# Patient Record
Sex: Female | Born: 1975 | Race: Black or African American | Hispanic: No | Marital: Single | Smoking: Current every day smoker
Health system: Southern US, Community
[De-identification: ages and names within clinical notes are randomized; demographics above are authoritative.]

## PROBLEM LIST (undated history)

## (undated) DIAGNOSIS — E669 Obesity, unspecified: Secondary | ICD-10-CM

## (undated) DIAGNOSIS — I82409 Acute embolism and thrombosis of unspecified deep veins of unspecified lower extremity: Secondary | ICD-10-CM

## (undated) DIAGNOSIS — I2699 Other pulmonary embolism without acute cor pulmonale: Secondary | ICD-10-CM

## (undated) DIAGNOSIS — E039 Hypothyroidism, unspecified: Secondary | ICD-10-CM

## (undated) DIAGNOSIS — E0590 Thyrotoxicosis, unspecified without thyrotoxic crisis or storm: Secondary | ICD-10-CM

## (undated) DIAGNOSIS — K219 Gastro-esophageal reflux disease without esophagitis: Secondary | ICD-10-CM

## (undated) DIAGNOSIS — E049 Nontoxic goiter, unspecified: Secondary | ICD-10-CM

## (undated) DIAGNOSIS — B20 Human immunodeficiency virus [HIV] disease: Secondary | ICD-10-CM

## (undated) DIAGNOSIS — Z21 Asymptomatic human immunodeficiency virus [HIV] infection status: Secondary | ICD-10-CM

## (undated) HISTORY — PX: HERNIA REPAIR: SHX51

---

## 2000-12-14 ENCOUNTER — Encounter (HOSPITAL_COMMUNITY): Admission: RE | Admit: 2000-12-14 | Discharge: 2001-01-13 | Payer: Self-pay | Admitting: *Deleted

## 2000-12-20 ENCOUNTER — Encounter: Admission: RE | Admit: 2000-12-20 | Discharge: 2000-12-20 | Payer: Self-pay | Admitting: Obstetrics

## 2001-01-03 ENCOUNTER — Encounter: Admission: RE | Admit: 2001-01-03 | Discharge: 2001-01-03 | Payer: Self-pay | Admitting: Obstetrics

## 2001-01-03 ENCOUNTER — Inpatient Hospital Stay (HOSPITAL_COMMUNITY): Admission: AD | Admit: 2001-01-03 | Discharge: 2001-01-08 | Payer: Self-pay | Admitting: Obstetrics & Gynecology

## 2001-01-04 ENCOUNTER — Encounter: Payer: Self-pay | Admitting: Obstetrics

## 2001-01-10 ENCOUNTER — Encounter: Admission: RE | Admit: 2001-01-10 | Discharge: 2001-01-10 | Payer: Self-pay | Admitting: Obstetrics

## 2001-01-10 ENCOUNTER — Inpatient Hospital Stay (HOSPITAL_COMMUNITY): Admission: AD | Admit: 2001-01-10 | Discharge: 2001-01-10 | Payer: Self-pay | Admitting: Obstetrics & Gynecology

## 2001-01-17 ENCOUNTER — Encounter: Admission: RE | Admit: 2001-01-17 | Discharge: 2001-01-17 | Payer: Self-pay | Admitting: Obstetrics

## 2001-01-24 ENCOUNTER — Encounter: Admission: RE | Admit: 2001-01-24 | Discharge: 2001-01-24 | Payer: Self-pay | Admitting: Obstetrics

## 2001-01-25 ENCOUNTER — Encounter (HOSPITAL_COMMUNITY): Admission: RE | Admit: 2001-01-25 | Discharge: 2001-02-11 | Payer: Self-pay | Admitting: Obstetrics & Gynecology

## 2001-01-31 ENCOUNTER — Encounter: Admission: RE | Admit: 2001-01-31 | Discharge: 2001-01-31 | Payer: Self-pay | Admitting: Obstetrics

## 2001-02-10 ENCOUNTER — Encounter: Payer: Self-pay | Admitting: Obstetrics & Gynecology

## 2001-02-10 ENCOUNTER — Inpatient Hospital Stay (HOSPITAL_COMMUNITY): Admission: AD | Admit: 2001-02-10 | Discharge: 2001-02-13 | Payer: Self-pay | Admitting: Obstetrics & Gynecology

## 2001-02-10 ENCOUNTER — Encounter (INDEPENDENT_AMBULATORY_CARE_PROVIDER_SITE_OTHER): Payer: Self-pay | Admitting: Specialist

## 2007-03-12 ENCOUNTER — Emergency Department (HOSPITAL_COMMUNITY): Admission: EM | Admit: 2007-03-12 | Discharge: 2007-03-12 | Payer: Self-pay | Admitting: Emergency Medicine

## 2007-06-18 ENCOUNTER — Emergency Department (HOSPITAL_COMMUNITY): Admission: EM | Admit: 2007-06-18 | Discharge: 2007-06-18 | Payer: Self-pay | Admitting: Emergency Medicine

## 2007-06-19 ENCOUNTER — Encounter (INDEPENDENT_AMBULATORY_CARE_PROVIDER_SITE_OTHER): Payer: Self-pay | Admitting: Emergency Medicine

## 2007-06-19 ENCOUNTER — Ambulatory Visit (HOSPITAL_COMMUNITY): Admission: RE | Admit: 2007-06-19 | Discharge: 2007-06-19 | Payer: Self-pay | Admitting: Emergency Medicine

## 2007-06-19 ENCOUNTER — Ambulatory Visit: Payer: Self-pay | Admitting: Vascular Surgery

## 2007-09-08 ENCOUNTER — Emergency Department (HOSPITAL_COMMUNITY): Admission: EM | Admit: 2007-09-08 | Discharge: 2007-09-09 | Payer: Self-pay | Admitting: Emergency Medicine

## 2008-01-06 ENCOUNTER — Emergency Department (HOSPITAL_COMMUNITY): Admission: EM | Admit: 2008-01-06 | Discharge: 2008-01-06 | Payer: Self-pay | Admitting: Emergency Medicine

## 2008-01-25 ENCOUNTER — Emergency Department (HOSPITAL_COMMUNITY): Admission: EM | Admit: 2008-01-25 | Discharge: 2008-01-26 | Payer: Self-pay | Admitting: Emergency Medicine

## 2008-01-26 ENCOUNTER — Ambulatory Visit: Payer: Self-pay | Admitting: Vascular Surgery

## 2008-01-26 ENCOUNTER — Encounter (INDEPENDENT_AMBULATORY_CARE_PROVIDER_SITE_OTHER): Payer: Self-pay | Admitting: Emergency Medicine

## 2008-01-26 ENCOUNTER — Ambulatory Visit (HOSPITAL_COMMUNITY): Admission: RE | Admit: 2008-01-26 | Discharge: 2008-01-26 | Payer: Self-pay | Admitting: Emergency Medicine

## 2008-06-02 ENCOUNTER — Emergency Department (HOSPITAL_BASED_OUTPATIENT_CLINIC_OR_DEPARTMENT_OTHER): Admission: EM | Admit: 2008-06-02 | Discharge: 2008-06-02 | Payer: Self-pay | Admitting: Emergency Medicine

## 2008-07-02 ENCOUNTER — Emergency Department (HOSPITAL_BASED_OUTPATIENT_CLINIC_OR_DEPARTMENT_OTHER): Admission: EM | Admit: 2008-07-02 | Discharge: 2008-07-02 | Payer: Self-pay | Admitting: Emergency Medicine

## 2008-10-25 ENCOUNTER — Ambulatory Visit: Payer: Self-pay | Admitting: Diagnostic Radiology

## 2008-10-25 ENCOUNTER — Emergency Department (HOSPITAL_BASED_OUTPATIENT_CLINIC_OR_DEPARTMENT_OTHER): Admission: EM | Admit: 2008-10-25 | Discharge: 2008-10-25 | Payer: Self-pay | Admitting: Emergency Medicine

## 2009-03-22 ENCOUNTER — Encounter: Payer: Self-pay | Admitting: Emergency Medicine

## 2009-03-22 ENCOUNTER — Ambulatory Visit: Payer: Self-pay | Admitting: Radiology

## 2009-03-23 ENCOUNTER — Inpatient Hospital Stay (HOSPITAL_COMMUNITY): Admission: EM | Admit: 2009-03-23 | Discharge: 2009-03-24 | Payer: Self-pay | Admitting: Internal Medicine

## 2009-05-23 ENCOUNTER — Ambulatory Visit: Payer: Self-pay | Admitting: Diagnostic Radiology

## 2009-05-23 ENCOUNTER — Emergency Department (HOSPITAL_BASED_OUTPATIENT_CLINIC_OR_DEPARTMENT_OTHER): Admission: EM | Admit: 2009-05-23 | Discharge: 2009-05-23 | Payer: Self-pay | Admitting: Emergency Medicine

## 2009-07-23 ENCOUNTER — Emergency Department (HOSPITAL_BASED_OUTPATIENT_CLINIC_OR_DEPARTMENT_OTHER): Admission: EM | Admit: 2009-07-23 | Discharge: 2009-07-23 | Payer: Self-pay | Admitting: Emergency Medicine

## 2009-11-16 ENCOUNTER — Emergency Department (HOSPITAL_BASED_OUTPATIENT_CLINIC_OR_DEPARTMENT_OTHER): Admission: EM | Admit: 2009-11-16 | Discharge: 2009-11-16 | Payer: Self-pay | Admitting: Emergency Medicine

## 2010-04-22 ENCOUNTER — Emergency Department (HOSPITAL_BASED_OUTPATIENT_CLINIC_OR_DEPARTMENT_OTHER): Admission: EM | Admit: 2010-04-22 | Discharge: 2010-04-22 | Payer: Self-pay | Admitting: Emergency Medicine

## 2010-11-25 ENCOUNTER — Emergency Department (INDEPENDENT_AMBULATORY_CARE_PROVIDER_SITE_OTHER): Payer: Self-pay

## 2010-11-25 ENCOUNTER — Emergency Department (HOSPITAL_BASED_OUTPATIENT_CLINIC_OR_DEPARTMENT_OTHER)
Admission: EM | Admit: 2010-11-25 | Discharge: 2010-11-25 | Disposition: A | Discharge status: Home / Self Care | Payer: Self-pay | Attending: Emergency Medicine | Admitting: Emergency Medicine

## 2010-11-25 ENCOUNTER — Emergency Department: Payer: Self-pay

## 2010-11-25 DIAGNOSIS — R0602 Shortness of breath: Secondary | ICD-10-CM

## 2010-11-25 DIAGNOSIS — Z79899 Other long term (current) drug therapy: Secondary | ICD-10-CM | POA: Insufficient documentation

## 2010-11-25 DIAGNOSIS — R05 Cough: Secondary | ICD-10-CM

## 2010-11-25 DIAGNOSIS — K219 Gastro-esophageal reflux disease without esophagitis: Secondary | ICD-10-CM | POA: Insufficient documentation

## 2010-11-25 DIAGNOSIS — F17200 Nicotine dependence, unspecified, uncomplicated: Secondary | ICD-10-CM | POA: Insufficient documentation

## 2010-11-25 DIAGNOSIS — D649 Anemia, unspecified: Secondary | ICD-10-CM | POA: Insufficient documentation

## 2010-11-25 DIAGNOSIS — R059 Cough, unspecified: Secondary | ICD-10-CM | POA: Insufficient documentation

## 2010-11-25 DIAGNOSIS — K7689 Other specified diseases of liver: Secondary | ICD-10-CM | POA: Insufficient documentation

## 2010-11-25 DIAGNOSIS — R079 Chest pain, unspecified: Secondary | ICD-10-CM

## 2010-11-25 LAB — DIFFERENTIAL
Basophils Relative: 0 % (ref 0–1)
Eosinophils Absolute: 0.1 10*3/uL (ref 0.0–0.7)
Lymphocytes Relative: 14 % (ref 12–46)
Lymphs Abs: 1.1 10*3/uL (ref 0.7–4.0)
Monocytes Absolute: 0.4 10*3/uL (ref 0.1–1.0)
Monocytes Relative: 8 % (ref 3–12)
Monocytes Relative: 11 % (ref 3–12)
Neutro Abs: 2.8 10*3/uL (ref 1.7–7.7)
Neutro Abs: 2.4 10*3/uL (ref 1.7–7.7)
Neutrophils Relative %: 64 % (ref 43–77)

## 2010-11-25 LAB — CBC
HCT: 31.3 % — ABNORMAL LOW (ref 36.0–46.0)
Hemoglobin: 11 g/dL — ABNORMAL LOW (ref 12.0–15.0)
MCH: 26.2 pg (ref 26.0–34.0)
MCHC: 33.2 g/dL (ref 30.0–36.0)
MCHC: 31.6 g/dL (ref 30.0–36.0)
Platelets: 180 10*3/uL (ref 150–400)
RBC: 4.19 MIL/uL (ref 3.87–5.11)
WBC: 4.4 10*3/uL (ref 4.0–10.5)
WBC: 3.4 10*3/uL — ABNORMAL LOW (ref 4.0–10.5)

## 2010-11-25 LAB — BASIC METABOLIC PANEL
BUN: 9 mg/dL (ref 6–23)
CO2: 25 mEq/L (ref 19–32)
Calcium: 10.1 mg/dL (ref 8.4–10.5)
Calcium: 9.8 mg/dL (ref 8.4–10.5)
Chloride: 106 mEq/L (ref 96–112)
Chloride: 108 mEq/L (ref 96–112)
Creatinine, Ser: 0.9 mg/dL (ref 0.4–1.2)
GFR calc non Af Amer: >60 mL/min (ref >60)
Glucose, Bld: 92 mg/dL (ref 70–99)
Potassium: 3.9 mEq/L (ref 3.5–5.1)

## 2010-11-25 LAB — D-DIMER, QUANTITATIVE: D-Dimer, Quant: 0.74 ug/mL-FEU — ABNORMAL HIGH (ref 0.00–0.48)

## 2010-11-25 MED ORDER — IOHEXOL 350 MG/ML SOLN
80.0000 mL | Freq: Once | INTRAVENOUS | Status: AC | PRN
  Administered 2010-11-25: 80 mL via INTRAVENOUS

## 2010-12-04 LAB — PREGNANCY, URINE: Preg Test, Ur: NEGATIVE

## 2010-12-04 LAB — DIFFERENTIAL
Basophils Absolute: 0 10*3/uL (ref 0.0–0.1)
Eosinophils Relative: 3 % (ref 0–5)
Neutrophils Relative %: 59 % (ref 43–77)

## 2010-12-04 LAB — CBC
MCHC: 32.2 g/dL (ref 30.0–36.0)
MCV: 78.2 fL (ref 78.0–100.0)
Platelets: 185 10*3/uL (ref 150–400)

## 2010-12-14 LAB — BASIC METABOLIC PANEL
Calcium: 9.9 mg/dL (ref 8.4–10.5)
GFR calc Af Amer: >60 mL/min (ref >60)
GFR calc non Af Amer: >60 mL/min (ref >60)
Potassium: 3.6 mEq/L (ref 3.5–5.1)
Sodium: 139 mEq/L (ref 135–145)

## 2010-12-14 LAB — POCT CARDIAC MARKERS
CKMB, poc: <1 ng/mL — ABNORMAL LOW (ref 1.0–8.0)
CKMB, poc: <1 ng/mL — ABNORMAL LOW (ref 1.0–8.0)
Myoglobin, poc: 39.7 ng/mL (ref 12–200)
Troponin i, poc: <0.05 ng/mL (ref 0.00–0.09)

## 2010-12-18 LAB — DIFFERENTIAL
Basophils Relative: 1 % (ref 0–1)
Monocytes Absolute: 0.4 10*3/uL (ref 0.1–1.0)
Monocytes Relative: 9 % (ref 3–12)
Neutro Abs: 2.5 10*3/uL (ref 1.7–7.7)

## 2010-12-18 LAB — COMPREHENSIVE METABOLIC PANEL
ALT: 12 U/L (ref 0–35)
Alkaline Phosphatase: 37 U/L — ABNORMAL LOW (ref 39–117)
BUN: 6 mg/dL (ref 6–23)
CO2: 25 mEq/L (ref 19–32)
Calcium: 9.1 mg/dL (ref 8.4–10.5)
GFR calc non Af Amer: >60 mL/min (ref >60)
Glucose, Bld: 87 mg/dL (ref 70–99)
Sodium: 137 mEq/L (ref 135–145)

## 2010-12-18 LAB — VITAMIN B12: Vitamin B-12: 355 pg/mL (ref 211–911)

## 2010-12-18 LAB — RETICULOCYTES
RBC.: 3.6 MIL/uL — ABNORMAL LOW (ref 3.87–5.11)
Retic Ct Pct: 1.6 % (ref 0.4–3.1)

## 2010-12-18 LAB — LIPID PANEL
Cholesterol: 204 mg/dL — ABNORMAL HIGH (ref 0–200)
LDL Cholesterol: 150 mg/dL — ABNORMAL HIGH (ref 0–99)
Total CHOL/HDL Ratio: 6 RATIO

## 2010-12-18 LAB — POCT CARDIAC MARKERS
CKMB, poc: 1 ng/mL (ref 1.0–8.0)
CKMB, poc: <1 ng/mL — ABNORMAL LOW (ref 1.0–8.0)
Myoglobin, poc: 36.5 ng/mL (ref 12–200)
Troponin i, poc: 0.36 ng/mL (ref 0.00–0.09)

## 2010-12-18 LAB — CARDIAC PANEL(CRET KIN+CKTOT+MB+TROPI)
CK, MB: 0.9 ng/mL (ref 0.3–4.0)
CK, MB: 1.2 ng/mL (ref 0.3–4.0)
Relative Index: INVALID (ref 0.0–2.5)
Relative Index: INVALID (ref 0.0–2.5)
Total CK: 85 U/L (ref 7–177)
Total CK: 99 U/L (ref 7–177)

## 2010-12-18 LAB — HEMOGLOBIN A1C
Hgb A1c MFr Bld: 5.6 % (ref 4.6–6.1)
Mean Plasma Glucose: 114 mg/dL

## 2010-12-18 LAB — CBC
HCT: 28.1 % — ABNORMAL LOW (ref 36.0–46.0)
Hemoglobin: 9.1 g/dL — ABNORMAL LOW (ref 12.0–15.0)
Hemoglobin: 9.3 g/dL — ABNORMAL LOW (ref 12.0–15.0)
Hemoglobin: 9.5 g/dL — ABNORMAL LOW (ref 12.0–15.0)
MCHC: 31.8 g/dL (ref 30.0–36.0)
MCHC: 32.5 g/dL (ref 30.0–36.0)
RBC: 3.56 MIL/uL — ABNORMAL LOW (ref 3.87–5.11)
RBC: 3.67 MIL/uL — ABNORMAL LOW (ref 3.87–5.11)
RBC: 3.67 MIL/uL — ABNORMAL LOW (ref 3.87–5.11)
RDW: 17.6 % — ABNORMAL HIGH (ref 11.5–15.5)
WBC: 3.6 10*3/uL — ABNORMAL LOW (ref 4.0–10.5)

## 2010-12-18 LAB — BASIC METABOLIC PANEL
CO2: 26 mEq/L (ref 19–32)
Calcium: 9.2 mg/dL (ref 8.4–10.5)
Calcium: 9.3 mg/dL (ref 8.4–10.5)
Chloride: 107 mEq/L (ref 96–112)
GFR calc Af Amer: >60 mL/min (ref >60)
GFR calc Af Amer: >60 mL/min (ref >60)
GFR calc non Af Amer: >60 mL/min (ref >60)
Sodium: 139 mEq/L (ref 135–145)
Sodium: 140 mEq/L (ref 135–145)

## 2010-12-18 LAB — D-DIMER, QUANTITATIVE: D-Dimer, Quant: 0.59 ug/mL-FEU — ABNORMAL HIGH (ref 0.00–0.48)

## 2010-12-18 LAB — IRON AND TIBC: UIBC: 333 ug/dL

## 2010-12-18 LAB — FERRITIN: Ferritin: 5 ng/mL — ABNORMAL LOW (ref 10–291)

## 2010-12-27 LAB — POCT CARDIAC MARKERS
CKMB, poc: <1 ng/mL — ABNORMAL LOW (ref 1.0–8.0)
Myoglobin, poc: 31.4 ng/mL (ref 12–200)
Troponin i, poc: <0.05 ng/mL (ref 0.00–0.09)

## 2010-12-27 LAB — BASIC METABOLIC PANEL
BUN: 10 mg/dL (ref 6–23)
Calcium: 9.5 mg/dL (ref 8.4–10.5)
Creatinine, Ser: 0.9 mg/dL (ref 0.4–1.2)
GFR calc non Af Amer: >60 mL/min (ref >60)
Glucose, Bld: 86 mg/dL (ref 70–99)
Sodium: 140 mEq/L (ref 135–145)

## 2010-12-27 LAB — CBC
Platelets: 153 10*3/uL (ref 150–400)
RDW: 16.3 % — ABNORMAL HIGH (ref 11.5–15.5)

## 2011-01-24 NOTE — Discharge Summary (Signed)
Stephanie Miranda, Stephanie Miranda              ACCOUNT NO.:  [id number]   MEDICAL RECORD NO.:  [id number]          PATIENT TYPE:  INP   LOCATION:  1430                         FACILITY:  [facility]   PHYSICIAN:  Hind I Elsaid, MD      DATE OF BIRTH:  1976-07-31   DATE OF ADMISSION:  03/23/2009  DATE OF DISCHARGE:  03/24/2009                               DISCHARGE SUMMARY   DISCHARGE DIAGNOSES:  1. Uncontrolled hypothyroidism.  2. Iron deficiency anemia secondary to menorrhagia.  3. Peripheral neuropathy.  4. Pancytopenia secondary to iron deficiency anemia.  5. Noncompliance.   DISCHARGE MEDICATIONS:  1. Synthroid 175 mcg p.o. daily.  2. Ferrous sulfate 325 mg 3 times daily.  3. LYRICA50 mg daily.   FOLLOWUP:  The patient to follow up with her primary care within the  next 2-3 weeks.   CONSULTATIONS:  None.   PROCEDURE:  1. Chest x-ray:  No infiltrate, CHF, or pneumothorax.  2. CT head:  No intracranial abnormality.  3. CT angio:  NEGATIVE FOR PE  MRI the brain:  No acute intracranial abnormalities. Nonspecific  mildly  advanced cerebral white matter  signal  abnormality .  1. MRA:  Negative intracranial MRA.   HISTORY OF PRESENT ILLNESS:  Please review the history done by Dr.  Michiel Cowboy.   She is a 35 year old, noncompliance with hypothyroidism medication  presents to the emergency room complaining of generalized pain and  numbness being admitted to Telemetry.  Cardiac enzyme was monitored.  DDIAMER_mildly elevated, CT angiogram was negative.  Lipid profile was  found to be:  LDL was 150 and cholesterol was 204.  Hemoglobin A1c was  found to be 506 and TSH was found to be 30.05 which was elevated  diagnosis of uncontrolled hypothyroidism was made which could be  possibility of all current patient symptoms.  Patient Synthroid dose was  increased to 175 mcg p.o. daily.  Also, patient noted to have  pancytopenia with INR 14 and total iron binding capacity of  347 and  patient  saturation 4%.  __________level found to be 5.  Patient has a  history menorrhagia.  Patient status post tubal ligation.  Patient was  started on ferrous sulfate iron supplement.  Guaiac was not done because  the patient symptom mainly secondary to heavy excessive vagina bleeding.  Patient  was advised to follow up with her gynecologist and her primary care  physician within the next 2-3 weeks.  Iron supplement was prescribed and  Lyrica for neuropathy was also prescribed.  At this time, we felt the  patient's further workup could be done as outpatient and need to follow  up with her primary care.      Hind Bosie Helper, MD  Electronically Signed     HIE/MEDQ  D:  03/24/2009  T:  03/24/2009  Job:  [id number]

## 2011-01-24 NOTE — H&P (Signed)
Stephanie Miranda, Stephanie Miranda              ACCOUNT NO.:  [id number]   MEDICAL RECORD NO.:  [id number]          PATIENT TYPE:  INP   LOCATION:  1430                         FACILITY:  [facility]   PHYSICIAN:  Michiel Cowboy, MDDATE OF BIRTH:  10/04/1975   DATE OF ADMISSION:  03/23/2009  DATE OF DISCHARGE:                              HISTORY & PHYSICAL   PRIMARY CARE PHYSICIAN:  None.   CHIEF COMPLAINT:  Chest pain, face pain.  The patient is a 35 year old  female with past medical history significant for hypothyroidism for  which she has not been treated for the past few weeks because she did  not fill her prescription.  The patient presented to the emergency  department with complaints of right chest sharp pains that at first were  intermittent but persisted, radiating to the right inner arm as well as  shoulder and then up to her right face and right side of her head.  Some  of this was more of a tingling kind of pain, but still fairly painful.  This had brought her into the emergency department.  She denies any  shortness of breath.  No nausea, no vomiting, no diaphoresis.  This has  been nonexertional, persisted for a number of hours.  While in the  emergency room, first set of cardiac markers showed troponin 0.34.  Repeat cardiac markers were unremarkable at which point she was admitted  and Medicine was called for an admission.   REVIEW OF SYSTEMS:  The patient endorses frequent episodes like this in  the past for which she had frequent elevation by ER as well as history  of chronic intermittent left leg tingling and pain.  Review of systems,  otherwise, unremarkable except for above.  No recent history of travel  or shortness of breath.  No diarrhea.  No fevers.   PAST MEDICAL HISTORY:  Hypothyroidism, otherwise, unremarkable..   SOCIAL HISTORY:  The patient continues to smoke about one third pack a  day, does not drink, does not abuse drugs.   FAMILY HISTORY:  No coronary  artery disease.  Mother has Bell's palsy,  but otherwise, family history unremarkable.   ALLERGIES:  NONE.   MEDICATIONS:  The patient is suppose to take Synthroid 150 mcg daily but  has not been taking it because she did not fill her prescription.  Also  takes Excedrin and morphine as needed.   PHYSICAL EXAMINATION:  VITALS:  Temperature 98.0, blood pressure 113/73,  pulse 52, respirations 21, satting 100% on 2 liters.  GENERAL:  The patient appears to be currently in no acute distress.  HEENT:  Nontraumatic.  Moist mucous membranes.  Eyes:  With some  protrusion bilaterally and worrisome for signs of Grave's disease.  No  tenderness over neck.  No worsening pain with pressure on top of the  head.  Currently, her pain is completely resolved.  LUNGS:  Clear to auscultation bilaterally.  HEART:  Slow, but regular.  No murmurs appreciated.  ABDOMEN:  Obese, but nontender, nondistended.  LOWER EXTREMITIES:  Without clubbing, cyanosis or edema.  Strength 5/5  throughout all four  extremities.  Cranial nerves II-XII intact,  otherwise, neurologically intact.   LABORATORY DATA:  White blood cell count 4.4, hemoglobin 9.3, sodium  139, potassium 3.9, creatinine 0.7, D-dimer is 059.  Cardiac markers,  first set, troponin was elevated 0.36.  on repeat, was less than 0.05.  this was both I-stat though markers are not back yet.  EKG showing sinus  rhythm, heart rate 54, sinus bradycardia.  In comparison to EKG in  February 2010, no significant changes.  No changes consistent with  ischemia or infarction.   Chest x-ray showing, no infiltrates, otherwise unremarkable.  CT scan of  the head showing no intracranial hemorrhage.  Mild exophthalmos noted.   ASSESSMENT/PLAN:  This is a 35 year old female with very atypical chest  pain but one set of positive cardiac markers which on repeat was  negative.  Most likely consistent with falsely elevated cardiac markers,  being admitted for  observation.  1. Chest pain, atypical, but continued to cycle cardiac enzymes, check      fasting lipid panel, hemoglobin A1c, TSH, serial EKG.  Admit to      tele.  2. Neurological symptoms of tingling on the right side as well as well      as left leg, very atypical.  Again, stroke is less likely and other      possibility could be multiple sclerosis or also possibility of      radiculopathy, stat MRI of the brain.  The patient may need further      imaging in the future, possibly of the neck.  If symptoms persist,      consider Neurology follow-up.  3. Elevated D-dimer.  Given sharp chest pain.  Will check a CT      angiogram of the chest.  4. Anemia.  Will do anemia workup.  Continue to follow.  5. Hypothyroidism.  Restart her Synthroid.  She needs a prescription      for this, and at the time of discharge, will check TSH to see where      she stands.  Given bradycardia and exophthalmus, I am concerned      that she has poorly treated hypothyroidism or possibility of      Graves' disease, pending the results of her TSH.      Michiel Cowboy, MD  Electronically Signed     AVD/MEDQ  D:  03/23/2009  T:  03/23/2009  Job:  [id number]

## 2011-01-27 NOTE — Op Note (Signed)
[facility]  Patient:    Stephanie Miranda, Stephanie Miranda                       MRN: [id number] Proc. Date: 02/10/01 Adm. Date:  [id number] Attending:  Antionette Char                           Operative Report  PREOPERATIVE DIAGNOSES:       1. Twin intrauterine gestation at 52 weeks.                               2. Active labor.                               3. Fetal heart tracing with repetitive moderate                                  to severe variable decelerations x 1.                               4. History of previous cesarean delivery.                               5. Multiparity, desires permanent sterilization                                  procedure.  POSTOPERATIVE DIAGNOSES:      1. Twin intrauterine gestation at 3 weeks.                               2. Active labor.                               3. Fetal heart tracing with repetitive moderate                                  to severe variable decelerations x 1.                               4. History of previous cesarean delivery.                               5. Multiparity, desires permanent sterilization                                  procedure.                               6. Incidental left ovarian cyst, simple.  PROCEDURE:                    Secondary low transverse cesarean section and  modified P[place]oy bilateral tubal ligation via                               Pfannenstiel.  SURGEON:                      Roseanna Rainbow, M.D.  ASSISTANT:                    Marlinda Mike, C.N.M.  ANESTHESIA:                   Spinal.  COMPLICATIONS:                None.  ESTIMATED BLOOD LOSS:         800 cc.  FLUIDS:                       1500 cc lactated Ringers.  URINE OUTPUT:                 300 cc clear urine.  INDICATIONS:                  The patient is a 35 year old multipara at 18 weeks who presented in active labor. Maximum dilatation 7 cm.  Spontaneous moderate to severe variable decelerations noted on one fetal heart tracing. The patient also desires a sterilization procedure. The risks and benefits of the procedure were discussed with the patient including the risk of failure of 4 to 8 per 1000 cases with an increased risk of an ectopic gestation if pregnancy occurs.  FINDINGS:                     Twin A, female infant in cephalic presentation, Apgars were 9, ______ . Umbilical artery pH of 7.37. Twin B, transverse lie, back up, female, Apgars 8 and 9, umbilical artery pH of 7.31. The weight for Twin A was 4 pounds 4 ounces and the weight for Twin B was 5 pounds 4 ounces. The left ovary had a simple-appearing 2 x 2 cm cyst.  DESCRIPTION OF PROCEDURE:     The patient was taken to the operating room where spinal anesthetic was administered and was found to be adequate. She was then prepped and draped in the usual sterile fashion in a dorsal supine position with a leftward tilt. A Pfannenstiel skin incision was then made through the previous scar with a scalpel and carried through to the underlying layer of fascia with the Bovie. The fascia was then nicked in the midline and the incision extended laterally with Mayo scissors. The superior aspect of the fascial incision was then grasped with Kocher clamps, elevated, and the underlying rectus muscles dissected off. Attention was then turned to the inferior aspect of this incision which was manipulate in a similar fashion. The rectus muscles were separated. The parietal peritoneum was identified, tented up, and entered sharply with Metzenbaum scissors. The peritoneal incision was then extended superiorly and inferiorly with good visualization of the bladder. The bladder blade was then inserted and the vesicouterine peritoneum identified, grasped with pickups, and entered sharply with the Metzenbaum scissors. This incision was then extended laterally and the bladder flap created  sharply. The bladder blade was then reinserted and the lower uterine segment incised in a transverse fashion with the scalpel. The uterine incision was extended laterally with the bandage scissors. The bladder blade was removed  and Twin A infants head was delivered atraumatically. The nose and mouth were suctioned with bulb suction, and the cord clamped and cut. The infant was handed off to the awaiting neonatologist. Cord gases were sent. Twin B was then delivered as a complete breech extraction. Again, the nose and mouth were suctioned with bulb suction, the cord clamped and cut, and the infant was handed off to the awaiting neonatologist. Again, cord gases were sent. The placenta was then removed, the uterus exteriorized, and cleared of all clots and debris. The uterine incision was repaired with 0 Monocryl in a running lock fashion. Excellent hemostasis was noted. The left fallopian tube was then grasped with the Babcock clamp approximately 4 cm from the cornual region. A 2 to 3 cm segment of tube was the ligated with two ties of plain gut and excised. Excellent hemostasis was noted. The right fallopian tube was then manipulated in a similar fashion. The uterus was returned to the abdomen. The gutters were cleared of all clots. The fascia was reapproximated with 0 PDS in a running fashion. The skin was closed with staples. The patient tolerated the procedure well. Sponge, lap, needle, and instrument counts were correct x 2. Cefotan 2 g were given at cord clamp. The patient was taken to the PACU in stable condition. DD:  02/10/01 TD:  02/10/01 Job: [id number]

## 2011-01-27 NOTE — Discharge Summary (Signed)
[facility]  Patient:    Stephanie Miranda, Stephanie Miranda                       MRN: [id number] Adm. Date:  [id number] Disc. Date: [id number] Attending:  Tammi Sou Dictator:   Jamey Reas, M.D.                           Discharge Summary  DATE OF BIRTH:                Dec 12, 1975  ADMISSION DIAGNOSES:          Twin intrauterine pregnancy, repeat cesarean section scheduled.  DISCHARGE DIAGNOSES:          Twin intrauterine pregnancy, repeat cesarean section scheduled.  COMPLICATIONS:                None.  CONSULTS:                     None.  PROCEDURE:                    Repeat low transverse cesarean section and bilateral tubal ligation performed by Dr. Antionette Char on February 10, 2001.  HISTORY OF PRESENT ILLNESS:   A 35 year old G8, P6-0-2-7 postoperative day #3 status post repeat low transverse cesarean section and bilateral tubal ligation for twins.  Patient had a routine postoperative course without complications.  Twin B boy was taken to the NICU and remains in the NICU for rule out sepsis as he had difficulty feeding and difficulty maintaining his sugar.  He had some hypoglycemic episodes while in the central nursery.  Twin A had Apgar scores of 9 at one minute and 9 at five minutes weighing 4 pounds 4 ounces.  Baby B had Apgar scores of 8 at one minute, 9 at five minutes weighing 5 pounds 4 ounces.  Mom had routine postoperative care.  She will be discharged home in stable condition.  DISCHARGE MEDICATIONS:        1. Prenatal vitamins one p.o. q.d. x 6 weeks.                               2. Motrin 600 mg one p.o. q.6-8h. p.r.n. pain.                               3. Percocet 5 mg 325 mg one to two p.o. q.4-6h.                                  p.r.n. pain.  DISCHARGE INSTRUCTIONS:       Routine postoperative instructions.  FOLLOW-UP:                    Patient will follow-up with [facility] in six weeks for further  evaluation. DD:  02/13/01 TD:  02/13/01 Job: [id number]

## 2011-01-27 NOTE — Discharge Summary (Signed)
[facility]  Patient:    Stephanie Miranda, Stephanie Miranda                       MRN: [id number] Adm. Date:  [id number] Disc. Date: 01/08/01 Attending:  Antionette Char Dictator:   Santiago Bumpers, M.D.                           Discharge Summary  ADMISSION DIAGNOSES:          1. Twin intrauterine pregnancy.                               2. Preterm labor.                               3. Hypothyroidism.                               4. Group B strep positive.                               5. History of pyelonephritis this                                  pregnancy-chronic suppression.  DISCHARGE DIAGNOSES:          1. Twin intrauterine pregnancy.                               2. Preterm labor.                               3. Hypothyroidism.                               4. Group B strep positive.                               5. History of pyelonephritis this                                  pregnancy-chronic suppression.  DISCHARGE MEDICATIONS:        1. Procardia XL 60 mg one tab q.d.                               2. Pepcid 20 mg b.i.d.                               3. Synthroid 0.125 mg q.d.                               4. Macrobid 100 mg q.d.  5. Colace 100 mg b.i.d.  CONSULTS:                     None.  PROCEDURE:                    Complete OB ultrasound on January 04, 2001. Impression:  Living twin gestation at 30 weeks 1 day, appropriate and concordant fetal growth, mildly shortened cervix measuring 2.7 cm.  HISTORY AND PHYSICAL:         For complete H&P please see residents H&P in chart.  Briefly, this was a 35 year old G8, P5-0-2-5 who presented at 30 weeks and 2/7 gestation with twin pregnancy and presented for evaluation for possible preterm labor.  Cervical dilation was documented the day of admission in high risk clinic and the patient had been contracting intermittently for the last two weeks.  PAST OBSTETRICAL  HISTORY:     Significant for a cesarean section in 1994 at term for breech and patient has had two spontaneous vaginal deliveries since cesarean section.  Also, during this pregnancy the patient had pyelonephritis and was supposed to be on suppression therapy, but had not been taking her medication.  PAST MEDICAL HISTORY:         Significant for hypothyroidism.  The patient reports taking 0.125 mg q.d. in the past and this had been increased as per her report to 0.150 when she became pregnant.  However, due to a move from [facility] and problem with Medicaid, she had not been taking her Synthroid for approximately the last one and a half months.  PHYSICAL EXAMINATION  VITAL SIGNS:                  Stable.  Afebrile.  PELVIC:                       Cervix 4 cm, soft, 80% effaced, and no presenting part could be palpated.  Fetal heart rate A was 135-145 and B was 130-140 with average long-term variability in both.                                The patient was having intermittent contractions and mild uterine irritability.  An ultrasound for presentation showed the twins in transverse/transverse lie.  This woman with a twin gestation at 30 weeks 2 days was admitted for preterm labor and placed on magnesium and hospital course is as follows.  HOSPITAL COURSE:              #1- PRETERM LABOR:  Patient was placed on magnesium.  Betamethasone was administered and Unasyn was begun.  Group B strep status was found the day after admission and was found to be positive. The patient contracted infrequently and rated them as being mild.  They were of a frequency of one to two per hour with many hours of no uterine contractions and only uterine irritability.  After hospital day #4 patient was taken off of magnesium and started on Procardia.  The contractions did not increase in frequency or intensity on Procardia.  On admission the patient was in Trendelenburg position and this was also  discontinued when Procardia was begun.  She had no problem with using bed side commode.  Due to the patient being stable on Procardia and out of Trendelenburg and using bed side commode, it was determined that she was a good candidate  to go home on complete bed rest and Procardia.  Care management visited with the patient and determined that she would have appropriate support at home for 24 hours a day and she was completely willing to do anything that was asked in order to keep her preterm labor stable.  Arrangements were made for her to go home on complete bed rest and Procardia and to follow-up in high risk clinic on Jan 16, 2001 at 9:20 a.m. Cervix check on the day of discharge did not show any significant change.  The cervix examination is somewhat difficult and patient has a very soft and stretchable cervix secondary to her multiparity.  On the day of discharge her examination by me was loose 2 cm, 50%, high, and very soft.                                #2 - HYPOTHYROIDISM:  As stated in H&P, patient carried this diagnosis prior to pregnancy, but had not been taking her medication.  When she established care at high risk OB clinic this month she was begun on 0.375 mg q.d. of Synthroid.  She was intermittently taking this at home.  She was begun on this again when she was admitted this hospitalization and this was decreased on hospital day #4 to 0.125 mg q.d. Check of her TSH and free T4 were 24.3 and 0.9 respectively.  She was discharged home on the same dose 0.125 mg q.d.                                #3 - GROUP B STREP POSITIVE:  Patient did have approximately five full days of IV Unasyn while in the hospital.  She did not go home on antibiotics for this.                                #4 - HISTORY OF PYELONEPHRITIS:  Patient had a diagnosis of pyelonephritis in October 2001, but had not been on any suppression antibiotics.  She was discharged home with Macrobid 100 mg  q.d. Urinalysis on this admission was negative and she did not have any fever or symptoms of UTI or pyelonephritis this hospitalization.   DISPOSITION:                  Home in stable condition.  DISCHARGE INSTRUCTIONS:       She was instructed to take the previously mentioned discharge medications.  DIET:                         No restrictions were placed.  ACTIVITY:                     Complete bed rest with privileges to use the bed side commode.  FOLLOW-UP:                    High risk clinic on Jan 16, 2001 at 9:20 a.m. DD:  01/08/01 TD:  01/08/01 Job: [id number]

## 2011-06-07 LAB — D-DIMER, QUANTITATIVE: D-Dimer, Quant: 0.62 — ABNORMAL HIGH

## 2011-06-13 LAB — D-DIMER, QUANTITATIVE: D-Dimer, Quant: 0.43

## 2011-06-16 LAB — DIFFERENTIAL
Basophils Absolute: 0
Basophils Relative: 0
Lymphocytes Relative: 32
Monocytes Absolute: 0.5
Neutro Abs: 2.9

## 2011-06-16 LAB — URINALYSIS, ROUTINE W REFLEX MICROSCOPIC
Bilirubin Urine: NEGATIVE
Ketones, ur: NEGATIVE
Nitrite: NEGATIVE
Specific Gravity, Urine: 1.021
Urobilinogen, UA: 1
pH: 7.5

## 2011-06-16 LAB — CBC
MCHC: 33.9
Platelets: 185
RDW: 15.1

## 2011-06-16 LAB — I-STAT 8, (EC8 V) (CONVERTED LAB)
BUN: 9
Bicarbonate: 27.1 — ABNORMAL HIGH
Chloride: 107
Operator id: 294511
pCO2, Ven: 42 — ABNORMAL LOW
pH, Ven: 7.417 — ABNORMAL HIGH

## 2011-06-16 LAB — POCT I-STAT CREATININE: Creatinine, Ser: 0.9

## 2011-06-22 LAB — COMPREHENSIVE METABOLIC PANEL
AST: 22
Albumin: 4
Alkaline Phosphatase: 43
BUN: 9
GFR calc Af Amer: >60
Potassium: 4
Sodium: 136
Total Protein: 7.6

## 2011-06-22 LAB — URINALYSIS, ROUTINE W REFLEX MICROSCOPIC
Bilirubin Urine: NEGATIVE
Hgb urine dipstick: NEGATIVE
Protein, ur: NEGATIVE
Urobilinogen, UA: 1

## 2011-06-22 LAB — I-STAT 8, (EC8 V) (CONVERTED LAB)
Bicarbonate: 23.1
Chloride: 108
HCT: 40
Hemoglobin: 13.6
Operator id: 285841
Sodium: 138
pCO2, Ven: 33.4 — ABNORMAL LOW

## 2011-06-22 LAB — T3 UPTAKE: T3 Uptake Ratio: 29.6

## 2011-06-22 LAB — CBC
Hemoglobin: 12.2
RBC: 4.39
WBC: 5.1

## 2011-06-22 LAB — POCT I-STAT CREATININE: Creatinine, Ser: 0.8

## 2011-06-22 LAB — POCT PREGNANCY, URINE
Operator id: 285841
Preg Test, Ur: NEGATIVE

## 2011-06-22 LAB — DIFFERENTIAL
Basophils Relative: 0
Lymphocytes Relative: 29
Lymphs Abs: 1.5
Monocytes Absolute: 0.4
Monocytes Relative: 9
Neutro Abs: 3
Neutrophils Relative %: 60

## 2011-06-22 LAB — TSH: TSH: 8.51 — ABNORMAL HIGH

## 2011-06-22 LAB — CK: Total CK: 185 — ABNORMAL HIGH

## 2011-06-22 LAB — T4, FREE: Free T4: 1.09

## 2011-06-27 LAB — DIFFERENTIAL
Basophils Relative: 0
Eosinophils Absolute: 0.2
Eosinophils Relative: 4
Lymphs Abs: 1.1
Monocytes Absolute: 0.5
Monocytes Relative: 9

## 2011-06-27 LAB — CBC
HCT: 36
Hemoglobin: 11.9 — ABNORMAL LOW
MCHC: 33.2
MCV: 83.6
RBC: 4.3

## 2011-06-27 LAB — BASIC METABOLIC PANEL
CO2: 26
Chloride: 106
Creatinine, Ser: 0.72
GFR calc Af Amer: >60
Potassium: 3.5
Sodium: 137

## 2011-07-07 ENCOUNTER — Emergency Department (INDEPENDENT_AMBULATORY_CARE_PROVIDER_SITE_OTHER): Payer: Self-pay

## 2011-07-07 ENCOUNTER — Emergency Department (HOSPITAL_BASED_OUTPATIENT_CLINIC_OR_DEPARTMENT_OTHER)
Admission: EM | Admit: 2011-07-07 | Discharge: 2011-07-08 | Disposition: A | Discharge status: Home / Self Care | Payer: Self-pay | Attending: Emergency Medicine | Admitting: Emergency Medicine

## 2011-07-07 ENCOUNTER — Other Ambulatory Visit: Payer: Self-pay

## 2011-07-07 DIAGNOSIS — R61 Generalized hyperhidrosis: Secondary | ICD-10-CM

## 2011-07-07 DIAGNOSIS — F17200 Nicotine dependence, unspecified, uncomplicated: Secondary | ICD-10-CM | POA: Insufficient documentation

## 2011-07-07 DIAGNOSIS — K219 Gastro-esophageal reflux disease without esophagitis: Secondary | ICD-10-CM | POA: Insufficient documentation

## 2011-07-07 DIAGNOSIS — R079 Chest pain, unspecified: Secondary | ICD-10-CM

## 2011-07-07 DIAGNOSIS — R42 Dizziness and giddiness: Secondary | ICD-10-CM

## 2011-07-07 DIAGNOSIS — E669 Obesity, unspecified: Secondary | ICD-10-CM | POA: Insufficient documentation

## 2011-07-07 DIAGNOSIS — R110 Nausea: Secondary | ICD-10-CM

## 2011-07-07 HISTORY — DX: Obesity, unspecified: E66.9

## 2011-07-07 HISTORY — DX: Hypothyroidism, unspecified: E03.9

## 2011-07-07 LAB — CBC
HCT: 32.9 % — ABNORMAL LOW (ref 36.0–46.0)
Hemoglobin: 10.6 g/dL — ABNORMAL LOW (ref 12.0–15.0)
MCH: 25.8 pg — ABNORMAL LOW (ref 26.0–34.0)
MCHC: 32.2 g/dL (ref 30.0–36.0)
RBC: 4.11 MIL/uL (ref 3.87–5.11)

## 2011-07-07 LAB — TROPONIN I: Troponin I: <0.3 ng/mL (ref <0.30)

## 2011-07-07 MED ORDER — GI COCKTAIL ~~LOC~~
30.0000 mL | Freq: Once | ORAL | Status: DC
  Filled 2011-07-07: qty 30

## 2011-07-07 NOTE — ED Notes (Signed)
Pt refused GI cocktail. Explained to pt that it would delay her feeling better and she stated "I dont care"

## 2011-07-07 NOTE — ED Notes (Signed)
States she has been taking tums and prilosec for GERD for the past few days. Epigastric pain is not getting better. Hx of hernia repair.

## 2011-07-07 NOTE — ED Notes (Signed)
Pt states that she has had chest pain for the past 3 days intermittently, pt states that right now it feels like a pressure burning tightness over the left chest.  Pt c/o lightheadedness, nausea, night sweats.

## 2011-07-08 LAB — COMPREHENSIVE METABOLIC PANEL
ALT: 13 U/L (ref 0–35)
Alkaline Phosphatase: 47 U/L (ref 39–117)
BUN: 9 mg/dL (ref 6–23)
CO2: 26 mEq/L (ref 19–32)
Calcium: 10.4 mg/dL (ref 8.4–10.5)
GFR calc Af Amer: >90 mL/min (ref >90)
GFR calc non Af Amer: >90 mL/min (ref >90)
Glucose, Bld: 99 mg/dL (ref 70–99)
Total Protein: 7.3 g/dL (ref 6.0–8.3)

## 2011-07-08 MED ORDER — LANSOPRAZOLE 30 MG PO CPDR: 30.0000 mg | DELAYED_RELEASE_CAPSULE | Freq: Every day | ORAL | Status: AC

## 2011-07-08 NOTE — ED Provider Notes (Signed)
History     CSN: [id number] Arrival date & time: 07/07/2011  9:53 PM   First MD Initiated Contact with Patient 07/07/11 2307      Chief Complaint  Patient presents with  . Chest Pain    (Consider location/radiation/quality/duration/timing/severity/associated sxs/prior treatment) Patient is a 35 y.o. female presenting with chest pain. The history is provided by the patient.  Chest Pain The chest pain began 3 - 5 days ago. Chest pain occurs constantly. The chest pain is unchanged. The pain is associated with eating. The severity of the pain is moderate. The quality of the pain is described as dull. The pain radiates to the epigastrium. Chest pain is worsened by eating. Primary symptoms include shortness of breath. Pertinent negatives for primary symptoms include no fever, no fatigue, no syncope, no cough, no abdominal pain, no nausea, no vomiting and no dizziness.  Pertinent negatives for associated symptoms include no diaphoresis, no numbness and no weakness.  Pertinent negatives for family medical history include: no CAD in family.  Procedure history is negative for stress thallium.     Past Medical History  Diagnosis Date  . Obesity   . Hypothyroidism     Past Surgical History  Procedure Date  . Hernia repair     History reviewed. No pertinent family history.  History  Substance Use Topics  . Smoking status: Current Everyday Smoker -- 1.0 packs/day for 25 years    Types: Cigarettes  . Smokeless tobacco: Not on file  . Alcohol Use: Yes     occasional    OB History    Grav Para Term Preterm Abortions TAB SAB Ect Mult Living                  Review of Systems  Constitutional: Negative for fever, chills, diaphoresis and fatigue.  HENT: Negative for congestion, rhinorrhea and sneezing.   Eyes: Negative.   Respiratory: Positive for shortness of breath. Negative for cough and chest tightness.   Cardiovascular: Positive for chest pain. Negative for leg swelling and  syncope.  Gastrointestinal: Negative for nausea, vomiting, abdominal pain, diarrhea and blood in stool.  Genitourinary: Negative for frequency, hematuria, flank pain and difficulty urinating.  Musculoskeletal: Negative for back pain and arthralgias.  Skin: Negative for rash.  Neurological: Negative for dizziness, speech difficulty, weakness, numbness and headaches.    Allergies  Review of patient's allergies indicates no known allergies.  Home Medications   Current Outpatient Rx  Name Route Sig Dispense Refill  . ASPIRIN EC 81 MG PO TBEC Oral Take 81 mg by mouth daily.      Marland Kitchen FERROUS SULFATE 325 (65 FE) MG PO TABS Oral Take 325 mg by mouth daily with breakfast.      . IBUPROFEN 200 MG PO TABS Oral Take 800 mg by mouth every 6 (six) hours as needed. For pain     . LEVOTHYROXINE SODIUM 175 MCG PO TABS Oral Take 175 mcg by mouth daily.      Marland Kitchen ONE-DAILY MULTI VITAMINS PO TABS Oral Take 1 tablet by mouth daily.      Marland Kitchen LANSOPRAZOLE 30 MG PO CPDR Oral Take 1 capsule (30 mg total) by mouth daily. 30 capsule 0    BP 121/66  Pulse 66  Temp 97.3 F (36.3 C)  Resp 19  Ht 5' 6.5" (1.689 m)  Wt 210 lb (95.255 kg)  BMI 33.39 kg/m2  SpO2 97%  LMP 06/05/2011  Physical Exam  Constitutional: She is oriented to person, place,  and time. She appears well-developed and well-nourished.  HENT:  Head: Normocephalic and atraumatic.  Eyes: Pupils are equal, round, and reactive to light.  Neck: Normal range of motion. Neck supple.  Cardiovascular: Normal rate, regular rhythm and normal heart sounds.   Pulmonary/Chest: Effort normal and breath sounds normal. No respiratory distress. She has no wheezes. She has no rales. She exhibits tenderness.       Mild tenderness to mid sternal area  Abdominal: Soft. Bowel sounds are normal. There is tenderness. There is no rebound and no guarding.       Mild tenderness to epigastrium  Musculoskeletal: Normal range of motion. She exhibits no edema.    Lymphadenopathy:    She has no cervical adenopathy.  Neurological: She is alert and oriented to person, place, and time.  Skin: Skin is warm and dry. No rash noted.  Psychiatric: She has a normal mood and affect.    ED Course  Procedures (including critical care time) Results for orders placed during the hospital encounter of 07/07/11  CBC      Component Value Range   WBC 5.0  4.0 - 10.5 (K/uL)   RBC 4.11  3.87 - 5.11 (MIL/uL)   Hemoglobin 10.6 (*) 12.0 - 15.0 (g/dL)   HCT 14.7 (*) 82.9 - 46.0 (%)   MCV 80.0  78.0 - 100.0 (fL)   MCH 25.8 (*) 26.0 - 34.0 (pg)   MCHC 32.2  30.0 - 36.0 (g/dL)   RDW 56.2 (*) 13.0 - 15.5 (%)   Platelets 161  150 - 400 (K/uL)  COMPREHENSIVE METABOLIC PANEL      Component Value Range   Sodium 141  135 - 145 (mEq/L)   Potassium 3.6  3.5 - 5.1 (mEq/L)   Chloride 107  96 - 112 (mEq/L)   CO2 26  19 - 32 (mEq/L)   Glucose, Bld 99  70 - 99 (mg/dL)   BUN 9  6 - 23 (mg/dL)   Creatinine, Ser 8.65  0.50 - 1.10 (mg/dL)   Calcium 78.4  8.4 - 10.5 (mg/dL)   Total Protein 7.3  6.0 - 8.3 (g/dL)   Albumin 3.6  3.5 - 5.2 (g/dL)   AST 15  0 - 37 (U/L)   ALT 13  0 - 35 (U/L)   Alkaline Phosphatase 47  39 - 117 (U/L)   Total Bilirubin 0.2 (*) 0.3 - 1.2 (mg/dL)   GFR calc non Af Amer >90  >90 (mL/min)   GFR calc Af Amer >90  >90 (mL/min)  TROPONIN I      Component Value Range   Troponin I <0.30  <0.30 (ng/mL)  LIPASE, BLOOD      Component Value Range   Lipase 38  11 - 59 (U/L)   Dg Chest 2 View  07/08/2011  *RADIOLOGY REPORT*  Clinical Data: Intermittent chest pain for 3 days. Lightheadedness, nausea, night sweats  CHEST - 2 VIEW  Comparison: 11/25/2010  Findings: The heart size and pulmonary vascularity are normal. The lungs appear clear and expanded without focal air space disease or consolidation. No blunting of the costophrenic angles.  Mild thoracic scoliosis.  No significant changes since the previous study.  IMPRESSION: No evidence of active pulmonary  disease.  Original Report Authenticated By: Marlon Pel, M.D.     No results found.  Date: 07/08/2011  Rate: 65  Rhythm: normal sinus rhythm  QRS Axis: normal  Intervals: normal  ST/T Wave abnormalities: nonspecific T wave changes  Conduction Disutrbances:none  Narrative Interpretation:   Old EKG Reviewed: unchanged    1. Chest pain   2. GERD (gastroesophageal reflux disease)       MDM  ACS vs gastritis vs GERD vs musculoskeletal pain  Labs negative, no evidence of pancreatitis.  Doubt ACS.  Pain is mostly in epigastrium, has associated heartburn, is also reproducible on chest wall.  Feel that it is most likely GI, but recommend that she has close f/u with her PMD.  Her only doctor at this point is her endocrinologist.  I recommend her f/u with this physician who can refer as needed.  Return here for any worsening symptoms.  Pt refused GI cocktail here.        Rolan Bucco, MD 07/08/11 [phone]

## 2011-07-08 NOTE — ED Notes (Signed)
Dr Fredderick Phenix informed that pt has refused the GI cocktail.  Pt is requesting to eat at this time.

## 2011-08-04 ENCOUNTER — Emergency Department (HOSPITAL_BASED_OUTPATIENT_CLINIC_OR_DEPARTMENT_OTHER)
Admission: EM | Admit: 2011-08-04 | Discharge: 2011-08-04 | Disposition: A | Discharge status: Home / Self Care | Payer: Self-pay | Attending: Emergency Medicine | Admitting: Emergency Medicine

## 2011-08-04 ENCOUNTER — Encounter (HOSPITAL_BASED_OUTPATIENT_CLINIC_OR_DEPARTMENT_OTHER): Payer: Self-pay

## 2011-08-04 DIAGNOSIS — F17200 Nicotine dependence, unspecified, uncomplicated: Secondary | ICD-10-CM | POA: Insufficient documentation

## 2011-08-04 DIAGNOSIS — E669 Obesity, unspecified: Secondary | ICD-10-CM | POA: Insufficient documentation

## 2011-08-04 DIAGNOSIS — Z79899 Other long term (current) drug therapy: Secondary | ICD-10-CM | POA: Insufficient documentation

## 2011-08-04 DIAGNOSIS — E079 Disorder of thyroid, unspecified: Secondary | ICD-10-CM | POA: Insufficient documentation

## 2011-08-04 DIAGNOSIS — J069 Acute upper respiratory infection, unspecified: Secondary | ICD-10-CM | POA: Insufficient documentation

## 2011-08-04 DIAGNOSIS — J029 Acute pharyngitis, unspecified: Secondary | ICD-10-CM | POA: Insufficient documentation

## 2011-08-04 LAB — RAPID STREP SCREEN (MED CTR MEBANE ONLY): Streptococcus, Group A Screen (Direct): NEGATIVE

## 2011-08-04 MED ORDER — ACETAMINOPHEN-CODEINE 120-12 MG/5ML PO SOLN: 10.0000 mL | ORAL | Status: AC | PRN

## 2011-08-04 MED ORDER — GUAIFENESIN ER 1200 MG PO TB12: 1.0000 | ORAL_TABLET | Freq: Two times a day (BID) | ORAL | Status: DC

## 2011-08-04 NOTE — ED Provider Notes (Signed)
History     CSN: [id number] Arrival date & time: 08/04/2011  8:06 PM   First MD Initiated Contact with Patient 08/04/11 2012      Chief Complaint  Patient presents with  . Sore Throat    (Consider location/radiation/quality/duration/timing/severity/associated sxs/prior treatment) Patient is a 34 y.o. female presenting with pharyngitis. The history is provided by the patient.  Sore Throat Associated symptoms include coughing, a sore throat, swollen glands and vomiting. Pertinent negatives include no abdominal pain, anorexia, chest pain, chills, congestion, diaphoresis, fatigue, fever, headaches, nausea, neck pain, rash, urinary symptoms or weakness.   Patient is a sore throat for the last one week denies fever, chest pain, shortness of breath, difficulty swallowing, or vomiting.  Patient, states she has had some postnasal drip as well and minor cough.  Past Medical History  Diagnosis Date  . Obesity   . Hypothyroidism     Past Surgical History  Procedure Date  . Hernia repair     No family history on file.  History  Substance Use Topics  . Smoking status: Current Everyday Smoker -- 1.0 packs/day for 25 years    Types: Cigarettes  . Smokeless tobacco: Not on file  . Alcohol Use: Yes     occasional    OB History    Grav Para Term Preterm Abortions TAB SAB Ect Mult Living                  Review of Systems  Constitutional: Negative for fever, chills, diaphoresis and fatigue.  HENT: Positive for sore throat. Negative for congestion and neck pain.   Respiratory: Positive for cough.   Cardiovascular: Negative for chest pain.  Gastrointestinal: Positive for vomiting. Negative for nausea, abdominal pain and anorexia.  Skin: Negative for rash.  Neurological: Negative for weakness and headaches.    Allergies  Review of patient's allergies indicates no known allergies.  Home Medications   Current Outpatient Rx  Name Route Sig Dispense Refill  . ASPIRIN EC 81 MG PO  TBEC Oral Take 81 mg by mouth daily.      Marland Kitchen FERROUS SULFATE 325 (65 FE) MG PO TABS Oral Take 325 mg by mouth daily with breakfast.      . IBUPROFEN 200 MG PO TABS Oral Take 800 mg by mouth every 6 (six) hours as needed. For pain     . LEVOTHYROXINE SODIUM 175 MCG PO TABS Oral Take 175 mcg by mouth daily.      Marland Kitchen ONE-DAILY MULTI VITAMINS PO TABS Oral Take 1 tablet by mouth daily.      Marland Kitchen OMEPRAZOLE MAGNESIUM 20 MG PO TBEC Oral Take 20 mg by mouth daily.      Marland Kitchen LANSOPRAZOLE 30 MG PO CPDR Oral Take 1 capsule (30 mg total) by mouth daily. 30 capsule 0    BP 122/80  Pulse 81  Temp(Src) 99 F (37.2 C) (Oral)  Resp 19  Ht 5\' 7"  (1.702 m)  Wt 210 lb (95.255 kg)  BMI 32.89 kg/m2  SpO2 98%  LMP 08/01/2011  Physical Exam  Constitutional: She is oriented to person, place, and time. She appears well-developed and well-nourished. No distress.  HENT:  Head: Normocephalic and atraumatic. No trismus in the jaw.  Nose: Nose normal.  Mouth/Throat: Uvula is midline and mucous membranes are normal. Posterior oropharyngeal erythema present. No oropharyngeal exudate, posterior oropharyngeal edema or tonsillar abscesses.  Cardiovascular: Normal rate, regular rhythm and normal heart sounds.  Exam reveals no gallop and no friction rub.  No murmur heard. Pulmonary/Chest: Effort normal and breath sounds normal. No respiratory distress. She has no wheezes. She has no rales.  Neurological: She is alert and oriented to person, place, and time.  Skin: Skin is warm and dry. No rash noted.    ED Course  Procedures (including critical care time)   Labs Reviewed  RAPID STREP SCREEN     She has a negative strep test here in the emergency department.  Patient also has a viral URI.      MDM  Patient was treated for viral URI based on history of present illness and physical exam.  She is advised to return here as needed.  She is told to increase her fluid intake       Jamesetta Orleans [place], [place] 08/04/11  2137

## 2011-08-04 NOTE — ED Notes (Signed)
Sore throat x 1 week

## 2011-08-05 NOTE — ED Provider Notes (Signed)
Medical screening examination/treatment/procedure(s) were performed by non-physician practitioner and as supervising physician I was immediately available for consultation/collaboration.   Forbes Cellar, MD 08/05/11 0020

## 2011-08-08 ENCOUNTER — Emergency Department (HOSPITAL_BASED_OUTPATIENT_CLINIC_OR_DEPARTMENT_OTHER)
Admission: EM | Admit: 2011-08-08 | Discharge: 2011-08-08 | Disposition: A | Discharge status: Home / Self Care | Payer: Self-pay | Attending: Emergency Medicine | Admitting: Emergency Medicine

## 2011-08-08 ENCOUNTER — Emergency Department (INDEPENDENT_AMBULATORY_CARE_PROVIDER_SITE_OTHER): Payer: Self-pay

## 2011-08-08 ENCOUNTER — Encounter (HOSPITAL_BASED_OUTPATIENT_CLINIC_OR_DEPARTMENT_OTHER): Payer: Self-pay | Admitting: Emergency Medicine

## 2011-08-08 DIAGNOSIS — E669 Obesity, unspecified: Secondary | ICD-10-CM | POA: Insufficient documentation

## 2011-08-08 DIAGNOSIS — M4120 Other idiopathic scoliosis, site unspecified: Secondary | ICD-10-CM

## 2011-08-08 DIAGNOSIS — E039 Hypothyroidism, unspecified: Secondary | ICD-10-CM | POA: Insufficient documentation

## 2011-08-08 DIAGNOSIS — J9819 Other pulmonary collapse: Secondary | ICD-10-CM

## 2011-08-08 DIAGNOSIS — J029 Acute pharyngitis, unspecified: Secondary | ICD-10-CM | POA: Insufficient documentation

## 2011-08-08 DIAGNOSIS — Z79899 Other long term (current) drug therapy: Secondary | ICD-10-CM | POA: Insufficient documentation

## 2011-08-08 DIAGNOSIS — R0989 Other specified symptoms and signs involving the circulatory and respiratory systems: Secondary | ICD-10-CM

## 2011-08-08 LAB — RAPID STREP SCREEN (MED CTR MEBANE ONLY): Streptococcus, Group A Screen (Direct): NEGATIVE

## 2011-08-08 MED ORDER — AMOXICILLIN 500 MG PO CAPS: 500.0000 mg | ORAL_CAPSULE | Freq: Three times a day (TID) | ORAL | Status: AC

## 2011-08-08 MED ORDER — AMOXICILLIN 500 MG PO CAPS
ORAL_CAPSULE | ORAL | Status: AC
  Administered 2011-08-08: 500 mg via ORAL
  Filled 2011-08-08: qty 1

## 2011-08-08 MED ORDER — AMOXICILLIN 500 MG PO CAPS
500.0000 mg | ORAL_CAPSULE | Freq: Once | ORAL | Status: AC
  Administered 2011-08-08: 500 mg via ORAL

## 2011-08-08 NOTE — ED Provider Notes (Signed)
History     CSN: [id number] Arrival date & time: 08/08/2011  7:43 PM   First MD Initiated Contact with Patient 08/08/11 2011      Chief Complaint  Patient presents with  . Sore Throat    (Consider location/radiation/quality/duration/timing/severity/associated sxs/prior treatment) HPI Comments: Pt states was seen here last week and was treated with symptomatic treatment and she is continuing to feel bad  Patient is a 35 y.o. female presenting with pharyngitis. The history is provided by the patient. No language interpreter was used.  Sore Throat This is a new problem. The current episode started 1 to 4 weeks ago. The problem occurs constantly. The problem has been unchanged. Associated symptoms include congestion, coughing, fatigue, a fever and a sore throat. Pertinent negatives include no rash. The symptoms are aggravated by nothing. She has tried nothing for the symptoms.    Past Medical History  Diagnosis Date  . Obesity   . Hypothyroidism     Past Surgical History  Procedure Date  . Hernia repair     No family history on file.  History  Substance Use Topics  . Smoking status: Current Everyday Smoker -- 1.0 packs/day for 25 years    Types: Cigarettes  . Smokeless tobacco: Not on file  . Alcohol Use: Yes     occasional    OB History    Grav Para Term Preterm Abortions TAB SAB Ect Mult Living                  Review of Systems  Constitutional: Positive for fever and fatigue.  HENT: Positive for congestion and sore throat.   Respiratory: Positive for cough.   Skin: Negative for rash.  All other systems reviewed and are negative.    Allergies  Review of patient's allergies indicates no known allergies.  Home Medications   Current Outpatient Rx  Name Route Sig Dispense Refill  . ACETAMINOPHEN-CODEINE 120-12 MG/5ML PO SOLN Oral Take 10 mLs by mouth every 4 (four) hours as needed for pain (cough). 120 mL 0  . ASPIRIN EC 81 MG PO TBEC Oral Take 81 mg by  mouth daily.      Marland Kitchen FERROUS SULFATE 325 (65 FE) MG PO TABS Oral Take 325 mg by mouth daily with breakfast.      . GUAIFENESIN 1200 MG PO TB12 Oral Take 1 tablet (1,200 mg total) by mouth 2 (two) times daily. 20 each 0  . IBUPROFEN 200 MG PO TABS Oral Take 800 mg by mouth every 6 (six) hours as needed. For pain     . LANSOPRAZOLE 30 MG PO CPDR Oral Take 1 capsule (30 mg total) by mouth daily. 30 capsule 0  . LEVOTHYROXINE SODIUM 175 MCG PO TABS Oral Take 175 mcg by mouth daily.      Marland Kitchen ONE-DAILY MULTI VITAMINS PO TABS Oral Take 1 tablet by mouth daily.      Marland Kitchen OMEPRAZOLE MAGNESIUM 20 MG PO TBEC Oral Take 20 mg by mouth daily.        BP 120/79  Pulse 92  Temp(Src) 99.2 F (37.3 C) (Oral)  Resp 18  SpO2 100%  LMP 08/01/2011  Physical Exam  Nursing note and vitals reviewed. Constitutional: She appears well-developed and well-nourished.  HENT:  Head: Normocephalic and atraumatic.  Left Ear: External ear normal.  Mouth/Throat: Posterior oropharyngeal edema and posterior oropharyngeal erythema present. No tonsillar abscesses.       tonsilar swelling and exudate  Neck: Normal range of motion.  Cardiovascular: Normal rate and regular rhythm.   Pulmonary/Chest: Effort normal and breath sounds normal.  Neurological: She is alert.  Skin: Skin is warm and dry.    ED Course  Procedures (including critical care time)  Labs Reviewed - No data to display Dg Chest 2 View  08/08/2011  *RADIOLOGY REPORT*  Clinical Data: Sore throat with difficulty swallowing.  Left ear pain.  Congestion.  Cough.  Lethargy.  CHEST - 2 VIEW  Comparison: 07/08/2011  Findings: Mild levoconvex lower thoracic scoliosis noted.  There is mild linear subsegmental atelectasis at the left lung base.  The lungs appear otherwise clear.  Cardiac and mediastinal contours appear unremarkable.  IMPRESSION:  1.  Subsegmental atelectasis at the left lung base. 2.  Mild scoliosis.  Original Report Authenticated By: Dellia Cloud, M.D.     1. Pharyngitis       MDM  Think it reasonable to treat with antibiotics based on exam and symptoms        Teressa Lower, NP 08/08/11 2152

## 2011-08-08 NOTE — ED Notes (Signed)
Pt seen here four days ago for same symptoms. States she is feeling worse, despite taking mucinex and tylenol/codeine. States sore throat has worsened, has difficulty swallowing, and now has left sided ear pain in additional to congestion and cough. Decreased energy.

## 2011-08-08 NOTE — ED Notes (Signed)
Pt c/o sore throat. Pt was seen here last week for same and reports symptoms are worsening. Pt states she is unable to eat.

## 2011-08-08 NOTE — Discharge Instructions (Signed)
Salt Water Gargle °This solution will help make your mouth and throat feel better. °HOME CARE INSTRUCTIONS  °· Mix 1 teaspoon of salt in 8 ounces of warm water.  °· Gargle with this solution as much or often as you need or as directed. Swish and gargle gently if you have any sores or wounds in your mouth.  °· Do not swallow this mixture.  °Document Released: 06/01/2004 Document Revised: 05/10/2011 Document Reviewed: 10/23/2008 °[employer]® Patient Information ©2012 [employer]. °

## 2011-08-09 NOTE — ED Provider Notes (Signed)
Medical screening examination/treatment/procedure(s) were performed by non-physician practitioner and as supervising physician I was immediately available for consultation/collaboration.   Glynn Octave, MD 08/09/11 [phone]

## 2011-10-24 ENCOUNTER — Encounter (HOSPITAL_BASED_OUTPATIENT_CLINIC_OR_DEPARTMENT_OTHER): Payer: Self-pay | Admitting: Family Medicine

## 2011-10-24 ENCOUNTER — Emergency Department (HOSPITAL_BASED_OUTPATIENT_CLINIC_OR_DEPARTMENT_OTHER)
Admission: EM | Admit: 2011-10-24 | Discharge: 2011-10-24 | Disposition: A | Discharge status: Home / Self Care | Payer: Self-pay | Attending: Emergency Medicine | Admitting: Emergency Medicine

## 2011-10-24 ENCOUNTER — Other Ambulatory Visit: Payer: Self-pay

## 2011-10-24 DIAGNOSIS — E039 Hypothyroidism, unspecified: Secondary | ICD-10-CM | POA: Insufficient documentation

## 2011-10-24 DIAGNOSIS — E669 Obesity, unspecified: Secondary | ICD-10-CM | POA: Insufficient documentation

## 2011-10-24 DIAGNOSIS — Z79899 Other long term (current) drug therapy: Secondary | ICD-10-CM | POA: Insufficient documentation

## 2011-10-24 DIAGNOSIS — G44209 Tension-type headache, unspecified, not intractable: Secondary | ICD-10-CM | POA: Insufficient documentation

## 2011-10-24 DIAGNOSIS — F17200 Nicotine dependence, unspecified, uncomplicated: Secondary | ICD-10-CM | POA: Insufficient documentation

## 2011-10-24 DIAGNOSIS — Z21 Asymptomatic human immunodeficiency virus [HIV] infection status: Secondary | ICD-10-CM | POA: Insufficient documentation

## 2011-10-24 HISTORY — DX: Human immunodeficiency virus (HIV) disease: B20

## 2011-10-24 HISTORY — DX: Asymptomatic human immunodeficiency virus (hiv) infection status: Z21

## 2011-10-24 MED ORDER — SODIUM CHLORIDE 0.9 % IV SOLN
Freq: Once | INTRAVENOUS | Status: AC
  Administered 2011-10-24: 1000 mL via INTRAVENOUS

## 2011-10-24 MED ORDER — DIPHENHYDRAMINE HCL 50 MG/ML IJ SOLN
25.0000 mg | Freq: Once | INTRAMUSCULAR | Status: AC
  Administered 2011-10-24: 25 mg via INTRAVENOUS
  Filled 2011-10-24: qty 1

## 2011-10-24 MED ORDER — METOCLOPRAMIDE HCL 5 MG/ML IJ SOLN
10.0000 mg | Freq: Once | INTRAMUSCULAR | Status: AC
  Administered 2011-10-24: 10 mg via INTRAVENOUS
  Filled 2011-10-24: qty 2

## 2011-10-24 MED ORDER — METOCLOPRAMIDE HCL 10 MG PO TABS: 10.0000 mg | ORAL_TABLET | Freq: Four times a day (QID) | ORAL | Status: AC | PRN

## 2011-10-24 NOTE — ED Notes (Addendum)
Pt sts she has had "tightness to right side of face and neck" with a right sided sharp pain in head since Friday. Pt sts "I'm concerned about having a stroke". Pt sts she is "more stressed because I was just diagnosed with HIV". No facial asymmetry noted, grips equal, ambulated with steady gait to room.

## 2011-10-24 NOTE — ED Provider Notes (Signed)
History     CSN: [id number]  Arrival date & time 10/24/11  1031   First MD Initiated Contact with Patient 10/24/11 1114      Chief Complaint  Patient presents with  . Headache    (Consider location/radiation/quality/duration/timing/severity/associated sxs/prior treatment) Patient is a 36 y.o. female presenting with headaches. The history is provided by the patient.  Headache   She had onset 4 days ago of a right parietal and occipital headache which actually improved over the next day and now has been constant for the last 2 days. The pain is occasionally sharp, but mostly is dull and achy. There is some radiation to her neck and sometimes into her chest or arm. Nothing makes the pain better nothing makes it worse. She tried taking ibuprofen with no relief. She thinks her vision may be slightly blurred. There's been no nausea, vomiting. Denies fever or chills. She has had similar headaches in the past. Pain is moderate and she rates it 6/10. She denies photophobia or phonophobia.  Past Medical History  Diagnosis Date  . Obesity   . Hypothyroidism   . HIV (human immunodeficiency virus infection)     Past Surgical History  Procedure Date  . Hernia repair     No family history on file.  History  Substance Use Topics  . Smoking status: Current Everyday Smoker -- 1.0 packs/day for 25 years    Types: Cigarettes  . Smokeless tobacco: Not on file  . Alcohol Use: Yes     occasional    OB History    Grav Para Term Preterm Abortions TAB SAB Ect Mult Living                  Review of Systems  Neurological: Positive for headaches.  All other systems reviewed and are negative.    Allergies  Review of patient's allergies indicates no known allergies.  Home Medications   Current Outpatient Rx  Name Route Sig Dispense Refill  . ASPIRIN EC 81 MG PO TBEC Oral Take 81 mg by mouth daily.      Marland Kitchen FERROUS SULFATE 325 (65 FE) MG PO TABS Oral Take 325 mg by mouth daily with  breakfast.      . GUAIFENESIN ER 1200 MG PO TB12 Oral Take 1 tablet (1,200 mg total) by mouth 2 (two) times daily. 20 each 0  . IBUPROFEN 200 MG PO TABS Oral Take 800 mg by mouth every 6 (six) hours as needed. For pain     . LEVOTHYROXINE SODIUM 175 MCG PO TABS Oral Take 175 mcg by mouth daily.      Marland Kitchen ONE-DAILY MULTI VITAMINS PO TABS Oral Take 1 tablet by mouth daily.      Marland Kitchen OMEPRAZOLE MAGNESIUM 20 MG PO TBEC Oral Take 20 mg by mouth daily.        BP 123/85  Pulse 85  Temp(Src) 98.5 F (36.9 C) (Oral)  Resp 16  Ht 5\' 6"  (1.676 m)  Wt 208 lb (94.348 kg)  BMI 33.57 kg/m2  SpO2 99%  LMP 09/27/2011  Physical Exam  Nursing note and vitals reviewed.  36 year old female is resting comfortably and in no acute distress. Vital signs are normal. Oxygen saturation is 99% which is normal. Head is normocephalic and 8 traumatic. PERRLA, EOMI. Fundi are normal. Oropharynx is clear. There is no facial asymmetry. There is moderate tenderness to palpation over the right temporalis muscle and over the insertion of the right paracervical muscles. There is no tenderness  palpation of the left side of the head. Neck is nontender and supple without adenopathy or JVD. Back is nontender. Lungs are clear without rales, wheezes, rhonchi. Heart has regular rate and rhythm without murmur. There is no chest wall tenderness. Abdomen is soft, flat, nontender without masses or hepatosplenomegaly. Extremities have no cyanosis or edema, full range of motion is present. Skin is warm and moist without rash. Neurologic: Mental status is normal, cranial nerves are intact, there no focal motor or sensory deficits.  ED Course  Procedures (including critical care time)  ECG shows normal sinus rhythm with a rate of 84, no ectopy. Normal axis. Normal P wave. Normal QRS. Normal intervals. Normal ST and T waves. Impression: normal ECG. When compared with ECG of 07/07/2011, no significant changes are seen.  She got good relief of  symptoms with IV metoclopramide and Benadryl. She'll be sent home with a prescription for metoclopramide tablets to use every 6 hours as needed. She is to continue using over-the-counter analgesics as needed.  1. Muscle contraction headache       MDM  Headache which seems most likely to be a muscle contraction headache. She'll be given a therapeutic trial of metoclopramide and Benadryl and reassessed.        Dione Booze, MD 10/24/11 [phone]

## 2011-10-24 NOTE — Discharge Instructions (Signed)
Tension Headache (Muscle Contraction Headache) Tension headache is one of the most common causes of head pain. These headaches are usually felt as a pain over the top of your head and back of your neck. Stress, anxiety, and depression are common triggers for these headaches. Tension headaches are not life-threatening and will not lead to other types of headaches. Tension headaches can often be diagnosed by taking a history from the patient and a physical exam. Sometimes, further lab and x-ray studies are used to confirm the diagnosis. Your caregiver can advise you on how to get help solving problems that cause anxiety or stress. Antidepressants can be prescribed if depression is a problem. HOME CARE INSTRUCTIONS   If testing was done, call for your results. Remember, it is your responsibility to get the results of all testing. Do not assume everything is fine because you do not hear from your caregiver.   Only take over-the-counter or prescription medicines for pain, discomfort, or fever as directed by your caregiver.   Biofeedback, massage, or other relaxation techniques may be helpful.   Ice packs or heat to the head and neck can be used. Use these three to four times per day or as needed.   Physical therapy may be a useful addition to treatment.   If headaches continue, even with therapy, you may need to think about lifestyle changes.   Avoid excessive use of pain killers, as rebound headaches can occur.  SEEK MEDICAL CARE IF:   You develop problems with medications prescribed.   You do not respond or get no relief from medications.   You have a change from the usual headache.   You develop nausea (feeling sick to your stomach) or vomiting.  SEEK IMMEDIATE MEDICAL CARE IF:   Your headache becomes severe.   You have an unexplained oral temperature above 102 F (38.9 C).   You develop a stiff neck.   You have loss of vision.   You have muscular weakness.   You have loss of  muscular control.   You develop severe symptoms different from your first symptoms.   You start losing your balance or have trouble walking.   You feel faint or pass out.  MAKE SURE YOU:   Understand these instructions.   Will watch your condition.   Will get help right away if you are not doing well or get worse.  Document Released: 08/28/2005 Document Revised: 05/10/2011 Document Reviewed: 04/16/2008 [facility] Patient Information 2012 [place], [place].  Metoclopramide tablets What is this medicine? METOCLOPRAMIDE (met oh kloe PRA mide) is used to treat the symptoms of gastroesophageal reflux disease (GERD) like heartburn. It is also used to treat people with slow emptying of the stomach and intestinal tract. This medicine may be used for other purposes; ask your health care provider or pharmacist if you have questions. What should I tell my health care provider before I take this medicine? They need to know if you have any of these conditions: -breast cancer -depression -diabetes -heart failure -high blood pressure -kidney disease -liver disease -Parkinson's disease or a movement disorder -pheochromocytoma -seizures -stomach obstruction, bleeding, or perforation -an unusual or allergic reaction to metoclopramide, procainamide, sulfites, other medicines, foods, dyes, or preservatives -pregnant or trying to get pregnant -breast-feeding How should I use this medicine? Take this medicine by mouth with a glass of water. Follow the directions on the prescription label. Take this medicine on an empty stomach, about 30 minutes before eating. Take your doses at regular intervals.  Do not take your medicine more often than directed. Do not stop taking except on the advice of your doctor or health care professional. A special MedGuide will be given to you by the pharmacist with each prescription and refill. Be sure to read this information carefully each time. Talk to your pediatrician  regarding the use of this medicine in children. Special care may be needed. Overdosage: If you think you have taken too much of this medicine contact a poison control center or emergency room at once. NOTE: This medicine is only for you. Do not share this medicine with others. What if I miss a dose? If you miss a dose, take it as soon as you can. If it is almost time for your next dose, take only that dose. Do not take double or extra doses. What may interact with this medicine? -acetaminophen -cyclosporine -digoxin -medicines for blood pressure -medicines for diabetes, including insulin -medicines for hay fever and other allergies -medicines for depression, especially an Monoamine Oxidase Inhibitor (MAOI) -medicines for Parkinson's disease, like levodopa -medicines for sleep or for pain -tetracycline This list may not describe all possible interactions. Give your health care provider a list of all the medicines, herbs, non-prescription drugs, or dietary supplements you use. Also tell them if you smoke, drink alcohol, or use illegal drugs. Some items may interact with your medicine. What should I watch for while using this medicine? It may take a few weeks for your stomach condition to start to get better. However, do not take this medicine for longer than 12 weeks. The longer you take this medicine, and the more you take it, the greater your chances are of developing serious side effects. If you are an elderly patient, a female patient, or you have diabetes, you may be at an increased risk for side effects from this medicine. Contact your doctor immediately if you start having movements you cannot control such as lip smacking, rapid movements of the tongue, involuntary or uncontrollable movements of the eyes, head, arms and legs, or muscle twitches and spasms. Patients and their families should watch out for worsening depression or thoughts of suicide. Also watch out for any sudden or severe  changes in feelings such as feeling anxious, agitated, panicky, irritable, hostile, aggressive, impulsive, severely restless, overly excited and hyperactive, or not being able to sleep. If this happens, especially at the beginning of treatment or after a change in dose, call your doctor. Do not treat yourself for high fever. Ask your doctor or health care professional for advice. You may get drowsy or dizzy. Do not drive, use machinery, or do anything that needs mental alertness until you know how this drug affects you. Do not stand or sit up quickly, especially if you are an older patient. This reduces the risk of dizzy or fainting spells. Alcohol can make you more drowsy and dizzy. Avoid alcoholic drinks. What side effects may I notice from receiving this medicine? Side effects that you should report to your doctor or health care professional as soon as possible: -allergic reactions like skin rash, itching or hives, swelling of the face, lips, or tongue -abnormal production of milk in females -breast enlargement in both males and females -change in the way you walk -difficulty moving, speaking or swallowing -drooling, lip smacking, or rapid movements of the tongue -excessive sweating -fever -involuntary or uncontrollable movements of the eyes, head, arms and legs -irregular heartbeat or palpitations -muscle twitches and spasms -unusually weak or tired Side effects that  usually do not require medical attention (report to your doctor or health care professional if they continue or are bothersome): -change in sex drive or performance -depressed mood -diarrhea -difficulty sleeping -headache -menstrual changes -restless or nervous This list may not describe all possible side effects. Call your doctor for medical advice about side effects. You may report side effects to FDA at 1-800-FDA-1088. Where should I keep my medicine? Keep out of the reach of children. Store at room temperature between  20 and 25 degrees C (68 and 77 degrees F). Protect from light. Keep container tightly closed. Throw away any unused medicine after the expiration date. NOTE: This sheet is a summary. It may not cover all possible information. If you have questions about this medicine, talk to your doctor, pharmacist, or health care provider.  2012, [employer]. (04/22/2008 4:30:05 PM)

## 2011-12-26 ENCOUNTER — Encounter (HOSPITAL_BASED_OUTPATIENT_CLINIC_OR_DEPARTMENT_OTHER): Payer: Self-pay | Admitting: *Deleted

## 2011-12-26 ENCOUNTER — Emergency Department (HOSPITAL_BASED_OUTPATIENT_CLINIC_OR_DEPARTMENT_OTHER)
Admission: EM | Admit: 2011-12-26 | Discharge: 2011-12-26 | Disposition: A | Discharge status: Home / Self Care | Payer: Self-pay | Attending: Emergency Medicine | Admitting: Emergency Medicine

## 2011-12-26 DIAGNOSIS — F17200 Nicotine dependence, unspecified, uncomplicated: Secondary | ICD-10-CM | POA: Insufficient documentation

## 2011-12-26 DIAGNOSIS — Z21 Asymptomatic human immunodeficiency virus [HIV] infection status: Secondary | ICD-10-CM | POA: Insufficient documentation

## 2011-12-26 DIAGNOSIS — R109 Unspecified abdominal pain: Secondary | ICD-10-CM | POA: Insufficient documentation

## 2011-12-26 DIAGNOSIS — Z7982 Long term (current) use of aspirin: Secondary | ICD-10-CM | POA: Insufficient documentation

## 2011-12-26 DIAGNOSIS — R112 Nausea with vomiting, unspecified: Secondary | ICD-10-CM | POA: Insufficient documentation

## 2011-12-26 DIAGNOSIS — N390 Urinary tract infection, site not specified: Secondary | ICD-10-CM | POA: Insufficient documentation

## 2011-12-26 DIAGNOSIS — E039 Hypothyroidism, unspecified: Secondary | ICD-10-CM | POA: Insufficient documentation

## 2011-12-26 LAB — COMPREHENSIVE METABOLIC PANEL
BUN: 11 mg/dL (ref 6–23)
Calcium: 10.1 mg/dL (ref 8.4–10.5)
Creatinine, Ser: 0.8 mg/dL (ref 0.50–1.10)
GFR calc Af Amer: >90 mL/min (ref >90)
Glucose, Bld: 101 mg/dL — ABNORMAL HIGH (ref 70–99)
Sodium: 138 mEq/L (ref 135–145)
Total Protein: 7.4 g/dL (ref 6.0–8.3)

## 2011-12-26 LAB — URINALYSIS, ROUTINE W REFLEX MICROSCOPIC
Ketones, ur: NEGATIVE mg/dL
Nitrite: NEGATIVE
Specific Gravity, Urine: 1.031 — ABNORMAL HIGH (ref 1.005–1.030)
pH: 6.5 (ref 5.0–8.0)

## 2011-12-26 LAB — CBC
MCH: 24.2 pg — ABNORMAL LOW (ref 26.0–34.0)
MCV: 74.8 fL — ABNORMAL LOW (ref 78.0–100.0)
Platelets: 179 10*3/uL (ref 150–400)
RBC: 3.89 MIL/uL (ref 3.87–5.11)

## 2011-12-26 LAB — DIFFERENTIAL
Eosinophils Absolute: 0.2 10*3/uL (ref 0.0–0.7)
Eosinophils Relative: 4 % (ref 0–5)
Lymphs Abs: 1.3 10*3/uL (ref 0.7–4.0)
Monocytes Relative: 9 % (ref 3–12)

## 2011-12-26 LAB — URINE MICROSCOPIC-ADD ON

## 2011-12-26 LAB — LIPASE, BLOOD: Lipase: 42 U/L (ref 11–59)

## 2011-12-26 MED ORDER — CEPHALEXIN 250 MG PO CAPS
500.0000 mg | ORAL_CAPSULE | Freq: Once | ORAL | Status: AC
  Administered 2011-12-26: 500 mg via ORAL
  Filled 2011-12-26: qty 2

## 2011-12-26 MED ORDER — SODIUM CHLORIDE 0.9 % IV BOLUS (SEPSIS)
1000.0000 mL | Freq: Once | INTRAVENOUS | Status: AC
  Administered 2011-12-26: 1000 mL via INTRAVENOUS

## 2011-12-26 MED ORDER — CEPHALEXIN 500 MG PO CAPS: 500.0000 mg | ORAL_CAPSULE | Freq: Four times a day (QID) | ORAL | Status: AC

## 2011-12-26 NOTE — ED Notes (Signed)
Pt did not have an IV in place upon this ER visit. 

## 2011-12-26 NOTE — Discharge Instructions (Signed)

## 2011-12-26 NOTE — ED Notes (Addendum)
Pt reports lower abdominal pains with hematuria since Saturday. Pain worsened today with nausea. Pain is now migrating to the back. Pt reports urinary frequency, urgency.

## 2011-12-26 NOTE — ED Provider Notes (Signed)
History     CSN: [id number]  Arrival date & time 12/26/11  0207   First MD Initiated Contact with Patient 12/26/11 0245      Chief Complaint  Patient presents with  . Abdominal Pain    (Consider location/radiation/quality/duration/timing/severity/associated sxs/prior treatment) HPI Patient is a 36 year old female with history of HIV who presents today complaining of dysuria as well as suprapubic and right lower back pain since Saturday which was 3 days ago. Patient has had nausea and vomiting at onset of this 3 days ago but not since. She denies any fevers. She has not had a urinary tract infection in a long time but feels that this is similar. She denies any vaginal discharge. Patient reports that she has been having some right upper quadrant abdominal pain over the past month that her primary care physician has been trying to get her to get an ultrasound for her. She does not have any of this pain this evening. Patient is concerned enough that she would like to have her labs checked to verify that she's not have any problems with her liver or pancreas. Patient has tried azo, increase fluid intake, and cranberry juice at home without relief. Patient has no radiation of her pain. There are no other associated or modifying factors. Past Medical History  Diagnosis Date  . Obesity   . Hypothyroidism   . HIV (human immunodeficiency virus infection)     Past Surgical History  Procedure Date  . Hernia repair     History reviewed. No pertinent family history.  History  Substance Use Topics  . Smoking status: Current Everyday Smoker -- 1.0 packs/day for 25 years    Types: Cigarettes  . Smokeless tobacco: Not on file  . Alcohol Use: Yes     rarely    OB History    Grav Para Term Preterm Abortions TAB SAB Ect Mult Living                  Review of Systems  Constitutional: Positive for appetite change and fatigue.  HENT: Negative.   Eyes: Negative.   Respiratory: Negative.     Cardiovascular: Negative.   Gastrointestinal: Positive for nausea, vomiting and abdominal pain.  Genitourinary: Positive for dysuria, frequency and flank pain. Negative for vaginal discharge.  Musculoskeletal: Negative.   Neurological: Negative.   Hematological: Negative.   Psychiatric/Behavioral: Negative.   All other systems reviewed and are negative.    Allergies  Review of patient's allergies indicates no known allergies.  Home Medications   Current Outpatient Rx  Name Route Sig Dispense Refill  . ASPIRIN EC 81 MG PO TBEC Oral Take 81 mg by mouth daily.      . STRIBILD PO Oral Take by mouth.    . FERROUS SULFATE 325 (65 FE) MG PO TABS Oral Take 325 mg by mouth daily with breakfast.      . IBUPROFEN 200 MG PO TABS Oral Take 800 mg by mouth every 6 (six) hours as needed. For pain     . LEVOTHYROXINE SODIUM 175 MCG PO TABS Oral Take 175 mcg by mouth daily.      Marland Kitchen ONE-DAILY MULTI VITAMINS PO TABS Oral Take 1 tablet by mouth daily.      Marland Kitchen OMEPRAZOLE MAGNESIUM 20 MG PO TBEC Oral Take 20 mg by mouth daily.      . GUAIFENESIN ER 1200 MG PO TB12 Oral Take 1 tablet (1,200 mg total) by mouth 2 (two) times daily. 20 each 0  BP 122/84  Pulse 94  Temp(Src) 97.5 F (36.4 C) (Oral)  Resp 16  SpO2 100%  LMP 11/25/2011  Physical Exam  Nursing note and vitals reviewed. GEN: Well-developed, well-nourished female in no distress HEENT: Atraumatic, normocephalic. Oropharynx clear without erythema EYES: PERRLA BL, no scleral icterus. NECK: Trachea midline, no meningismus CV: regular rate and rhythm. No murmurs, rubs, or gallops PULM: No respiratory distress.  No crackles, wheezes, or rales. GI: soft, mild tenderness to palpation over the suprapubic region. No guarding or rebound. + bowel sounds  GU: deferred Neuro: cranial nerves 2-12 intact, no abnormalities of strength or sensation, A and O x 3 MSK: Patient moves all 4 extremities symmetrically, no deformity, edema, or injury  noted Skin: No rashes petechiae, purpura, or jaundice Psych: no abnormality of mood   ED Course  Procedures (including critical care time)  Labs Reviewed  URINALYSIS, ROUTINE W REFLEX MICROSCOPIC - Abnormal; Notable for the following:    APPearance CLOUDY (*)    Specific Gravity, Urine 1.031 (*)    Hgb urine dipstick SMALL (*)    Protein, ur 30 (*)    Leukocytes, UA LARGE (*)    All other components within normal limits  URINE MICROSCOPIC-ADD ON - Abnormal; Notable for the following:    Bacteria, UA FEW (*)    All other components within normal limits  CBC - Abnormal; Notable for the following:    Hemoglobin 9.4 (*)    HCT 29.1 (*)    MCV 74.8 (*)    MCH 24.2 (*)    RDW 15.9 (*)    All other components within normal limits  COMPREHENSIVE METABOLIC PANEL - Abnormal; Notable for the following:    Glucose, Bld 101 (*)    Total Bilirubin 0.2 (*)    All other components within normal limits  PREGNANCY, URINE  DIFFERENTIAL  LIPASE, BLOOD  URINE CULTURE   No results found.   1. UTI (urinary tract infection)       MDM  Patient was evaluated by myself. Symptoms are most consistent with urinary tract infection. Patient and I discussed the options for workup this evening. She was concerned that there could be something else going on and she had had symptoms for the past month and her right upper quadrant. We agreed upon laboratory workup without imaging to assess this. Patient did have urinary tract infection and a urine culture was sent. Keflex was given and patient was given a prescription for 10 days of this. Patient denied wanting pain or nausea medications. She did want IV fluid this evening and this was ordered.  Labs returned normal. Patient was discharged in good condition with prescription for Keflex. She can followup with her regular doctor she has any other emergent concerns.        Cyndra Numbers, MD 12/26/11 [phone]

## 2011-12-28 LAB — URINE CULTURE: Culture  Setup Time: 201304161711

## 2011-12-29 NOTE — ED Notes (Signed)
+   Urine  Treated per protocol MD; Sensitive to same 

## 2012-03-13 ENCOUNTER — Emergency Department (HOSPITAL_BASED_OUTPATIENT_CLINIC_OR_DEPARTMENT_OTHER)
Admission: EM | Admit: 2012-03-13 | Discharge: 2012-03-13 | Disposition: A | Discharge status: Home / Self Care | Payer: Self-pay | Attending: Emergency Medicine | Admitting: Emergency Medicine

## 2012-03-13 ENCOUNTER — Encounter (HOSPITAL_BASED_OUTPATIENT_CLINIC_OR_DEPARTMENT_OTHER): Payer: Self-pay | Admitting: *Deleted

## 2012-03-13 ENCOUNTER — Emergency Department (HOSPITAL_BASED_OUTPATIENT_CLINIC_OR_DEPARTMENT_OTHER): Payer: Self-pay

## 2012-03-13 DIAGNOSIS — F17200 Nicotine dependence, unspecified, uncomplicated: Secondary | ICD-10-CM | POA: Insufficient documentation

## 2012-03-13 DIAGNOSIS — R599 Enlarged lymph nodes, unspecified: Secondary | ICD-10-CM | POA: Insufficient documentation

## 2012-03-13 DIAGNOSIS — E669 Obesity, unspecified: Secondary | ICD-10-CM | POA: Insufficient documentation

## 2012-03-13 DIAGNOSIS — Z21 Asymptomatic human immunodeficiency virus [HIV] infection status: Secondary | ICD-10-CM | POA: Insufficient documentation

## 2012-03-13 DIAGNOSIS — L089 Local infection of the skin and subcutaneous tissue, unspecified: Secondary | ICD-10-CM

## 2012-03-13 DIAGNOSIS — E039 Hypothyroidism, unspecified: Secondary | ICD-10-CM | POA: Insufficient documentation

## 2012-03-13 DIAGNOSIS — R51 Headache: Secondary | ICD-10-CM | POA: Insufficient documentation

## 2012-03-13 DIAGNOSIS — E049 Nontoxic goiter, unspecified: Secondary | ICD-10-CM

## 2012-03-13 DIAGNOSIS — Z7982 Long term (current) use of aspirin: Secondary | ICD-10-CM | POA: Insufficient documentation

## 2012-03-13 LAB — CBC WITH DIFFERENTIAL/PLATELET
Basophils Relative: 0 % (ref 0–1)
Eosinophils Absolute: 0.1 10*3/uL (ref 0.0–0.7)
HCT: 31.2 % — ABNORMAL LOW (ref 36.0–46.0)
Hemoglobin: 10.2 g/dL — ABNORMAL LOW (ref 12.0–15.0)
Lymphs Abs: 1.4 10*3/uL (ref 0.7–4.0)
MCH: 24.9 pg — ABNORMAL LOW (ref 26.0–34.0)
MCHC: 32.7 g/dL (ref 30.0–36.0)
Monocytes Absolute: 0.5 10*3/uL (ref 0.1–1.0)
Monocytes Relative: 12 % (ref 3–12)

## 2012-03-13 LAB — URINALYSIS, ROUTINE W REFLEX MICROSCOPIC
Bilirubin Urine: NEGATIVE
Hgb urine dipstick: NEGATIVE
Ketones, ur: NEGATIVE mg/dL
Nitrite: NEGATIVE
pH: 6.5 (ref 5.0–8.0)

## 2012-03-13 LAB — PROTIME-INR
INR: 0.96 (ref 0.00–1.49)
Prothrombin Time: 13 seconds (ref 11.6–15.2)

## 2012-03-13 LAB — BASIC METABOLIC PANEL
BUN: 13 mg/dL (ref 6–23)
Chloride: 103 mEq/L (ref 96–112)
Creatinine, Ser: 0.8 mg/dL (ref 0.50–1.10)
GFR calc Af Amer: >90 mL/min (ref >90)
Glucose, Bld: 104 mg/dL — ABNORMAL HIGH (ref 70–99)

## 2012-03-13 LAB — URINE MICROSCOPIC-ADD ON

## 2012-03-13 MED ORDER — DOXYCYCLINE HYCLATE 100 MG PO TABS
100.0000 mg | ORAL_TABLET | Freq: Once | ORAL | Status: AC
  Administered 2012-03-13: 100 mg via ORAL
  Filled 2012-03-13: qty 1

## 2012-03-13 MED ORDER — DOXYCYCLINE HYCLATE 100 MG PO CAPS: 100.0000 mg | ORAL_CAPSULE | Freq: Two times a day (BID) | ORAL | Status: AC

## 2012-03-13 MED ORDER — METOCLOPRAMIDE HCL 5 MG/ML IJ SOLN
10.0000 mg | Freq: Once | INTRAMUSCULAR | Status: AC
  Administered 2012-03-13: 10 mg via INTRAVENOUS

## 2012-03-13 MED ORDER — METOCLOPRAMIDE HCL 5 MG/ML IJ SOLN
10.0000 mg | Freq: Once | INTRAMUSCULAR | Status: DC
  Filled 2012-03-13: qty 2

## 2012-03-13 MED ORDER — DIPHENHYDRAMINE HCL 50 MG/ML IJ SOLN
25.0000 mg | Freq: Once | INTRAMUSCULAR | Status: AC
  Administered 2012-03-13: 25 mg via INTRAVENOUS

## 2012-03-13 MED ORDER — DIPHENHYDRAMINE HCL 50 MG/ML IJ SOLN
25.0000 mg | Freq: Once | INTRAMUSCULAR | Status: DC
  Filled 2012-03-13: qty 1

## 2012-03-13 MED ORDER — IOHEXOL 300 MG/ML  SOLN
80.0000 mL | Freq: Once | INTRAMUSCULAR | Status: AC | PRN
  Administered 2012-03-13: 80 mL via INTRAVENOUS

## 2012-03-13 NOTE — ED Provider Notes (Addendum)
History     CSN: [id number]  Arrival date & time 03/13/12  0052   First MD Initiated Contact with Patient 03/13/12 0059      Chief Complaint  Patient presents with  . Headache    (Consider location/radiation/quality/duration/timing/severity/associated sxs/prior treatment) Patient is a 36 y.o. female presenting with headaches. The history is provided by the patient. No language interpreter was used.  Headache  This is a recurrent problem. The current episode started more than 1 week ago. The problem occurs constantly. The problem has not changed since onset.The headache is associated with nothing. The pain is located in the occipital region. The pain is moderate. The pain radiates to the right neck. Pertinent negatives include no anorexia, no fever, no malaise/fatigue, no chest pressure, no near-syncope, no orthopnea, no palpitations, no syncope, no shortness of breath, no nausea and no vomiting. She has tried nothing for the symptoms. The treatment provided no relief.  Notes that the headaches start at occiput where she notes that there is a "knot".  No f/c/r.  No neck pain or stiffness.  States that her most recent viral load done at [facility] was undetectable.  No trauma, recent nor remote.  No tick exposure.  Did have hair relaxer treatment  Past Medical History  Diagnosis Date  . Obesity   . Hypothyroidism   . HIV (human immunodeficiency virus infection)     Past Surgical History  Procedure Date  . Hernia repair     History reviewed. No pertinent family history.  History  Substance Use Topics  . Smoking status: Current Everyday Smoker -- 1.0 packs/day for 25 years    Types: Cigarettes  . Smokeless tobacco: Not on file  . Alcohol Use: Yes     rarely    OB History    Grav Para Term Preterm Abortions TAB SAB Ect Mult Living                  Review of Systems  Constitutional: Negative for fever and malaise/fatigue.  HENT: Negative for neck stiffness.   Eyes: Negative for  photophobia.  Respiratory: Negative for shortness of breath.   Cardiovascular: Negative for palpitations, orthopnea, syncope and near-syncope.  Gastrointestinal: Negative for nausea, vomiting and anorexia.  Neurological: Positive for headaches.  All other systems reviewed and are negative.    Allergies  Review of patient's allergies indicates no known allergies.  Home Medications   Current Outpatient Rx  Name Route Sig Dispense Refill  . ASPIRIN EC 81 MG PO TBEC Oral Take 81 mg by mouth daily.      . STRIBILD PO Oral Take by mouth.    . FERROUS SULFATE 325 (65 FE) MG PO TABS Oral Take 325 mg by mouth daily with breakfast.      . GUAIFENESIN ER 1200 MG PO TB12 Oral Take 1 tablet (1,200 mg total) by mouth 2 (two) times daily. 20 each 0  . IBUPROFEN 200 MG PO TABS Oral Take 800 mg by mouth every 6 (six) hours as needed. For pain     . LEVOTHYROXINE SODIUM 175 MCG PO TABS Oral Take 175 mcg by mouth daily.      Marland Kitchen ONE-DAILY MULTI VITAMINS PO TABS Oral Take 1 tablet by mouth daily.      Marland Kitchen OMEPRAZOLE MAGNESIUM 20 MG PO TBEC Oral Take 20 mg by mouth daily.        BP 132/84  Pulse 74  Temp 98.3 F (36.8 C)  Resp 16  Ht 5'  6" (1.676 m)  Wt 210 lb (95.255 kg)  BMI 33.89 kg/m2  SpO2 100%  LMP 02/25/2012  Physical Exam  Constitutional: She is oriented to person, place, and time. She appears well-developed and well-nourished. No distress.  HENT:  Head: Macrocephalic.    Right Ear: No mastoid tenderness. Tympanic membrane is not injected. No hemotympanum.  Left Ear: No mastoid tenderness. Tympanic membrane is not injected. No hemotympanum.  Mouth/Throat: Oropharynx is clear and moist. No oropharyngeal exudate.  Eyes: Conjunctivae and EOM are normal. Pupils are equal, round, and reactive to light.  Neck: Normal range of motion. Neck supple.  Cardiovascular: Normal rate and regular rhythm.   Pulmonary/Chest: Effort normal and breath sounds normal. She has no wheezes. She has no rales.    Abdominal: Soft. Bowel sounds are normal. There is no tenderness. There is no rebound and no guarding.  Musculoskeletal: Normal range of motion.  Lymphadenopathy:    She has cervical adenopathy.  Neurological: She is alert and oriented to person, place, and time. She has normal reflexes. No cranial nerve deficit.  Skin: Skin is warm and dry. No rash noted.  Psychiatric: She has a normal mood and affect.    ED Course  Procedures (including critical care time)  Labs Reviewed  CBC WITH DIFFERENTIAL - Abnormal; Notable for the following:    Hemoglobin 10.2 (*)     HCT 31.2 (*)     MCV 76.1 (*)     MCH 24.9 (*)     RDW 17.6 (*)     Platelets 149 (*)     All other components within normal limits  URINALYSIS, ROUTINE W REFLEX MICROSCOPIC - Abnormal; Notable for the following:    Leukocytes, UA SMALL (*)     All other components within normal limits  PREGNANCY, URINE  PROTIME-INR  URINE MICROSCOPIC-ADD ON  BASIC METABOLIC PANEL   No results found.   No diagnosis found.    MDM  89 case d/w D.r Calisano, if no infection.  No antibiotics.  Will leave note for Dr. Servando Snare of [facility].  Will schedule follow up with thyroid US and MRI if needed.  Follow up immediately for fevers, stiff neck, rashes on the skin or any concerns as [facility] has urgent clinic.  As abrasion noted will treat with doxy.  Patient informed of need for MRI and [place] of thyroid.  Patient and husband verbalize understanding and agree to follow up.  W        Lourdez Mcgahan K Danyael Alipio-Rasch, MD 03/13/12 1610  Jasmine Awe, MD 03/13/12 [phone]

## 2012-03-13 NOTE — ED Notes (Signed)
Returned from CT.

## 2012-03-13 NOTE — ED Notes (Signed)
Patient transported to Ct. 

## 2012-03-13 NOTE — ED Notes (Signed)
Pt c/o h/a and neck pain with " knot" to posterior head

## 2012-03-25 ENCOUNTER — Encounter (HOSPITAL_BASED_OUTPATIENT_CLINIC_OR_DEPARTMENT_OTHER): Payer: Self-pay

## 2012-03-25 ENCOUNTER — Emergency Department (HOSPITAL_BASED_OUTPATIENT_CLINIC_OR_DEPARTMENT_OTHER)
Admission: EM | Admit: 2012-03-25 | Discharge: 2012-03-25 | Disposition: A | Discharge status: Home / Self Care | Payer: Self-pay | Attending: Emergency Medicine | Admitting: Emergency Medicine

## 2012-03-25 DIAGNOSIS — Z21 Asymptomatic human immunodeficiency virus [HIV] infection status: Secondary | ICD-10-CM | POA: Insufficient documentation

## 2012-03-25 DIAGNOSIS — E669 Obesity, unspecified: Secondary | ICD-10-CM | POA: Insufficient documentation

## 2012-03-25 DIAGNOSIS — R51 Headache: Secondary | ICD-10-CM | POA: Insufficient documentation

## 2012-03-25 DIAGNOSIS — F17200 Nicotine dependence, unspecified, uncomplicated: Secondary | ICD-10-CM | POA: Insufficient documentation

## 2012-03-25 DIAGNOSIS — E039 Hypothyroidism, unspecified: Secondary | ICD-10-CM | POA: Insufficient documentation

## 2012-03-25 DIAGNOSIS — R519 Headache, unspecified: Secondary | ICD-10-CM

## 2012-03-25 MED ORDER — ACETAMINOPHEN-CODEINE #3 300-30 MG PO TABS: 1.0000 | ORAL_TABLET | Freq: Four times a day (QID) | ORAL | Status: AC | PRN

## 2012-03-25 MED ORDER — CEPHALEXIN 500 MG PO CAPS: 500.0000 mg | ORAL_CAPSULE | Freq: Three times a day (TID) | ORAL | Status: AC

## 2012-03-25 MED ORDER — ACETAMINOPHEN-CODEINE #3 300-30 MG PO TABS
2.0000 | ORAL_TABLET | Freq: Once | ORAL | Status: AC
  Administered 2012-03-25: 2 via ORAL
  Filled 2012-03-25: qty 2

## 2012-03-25 NOTE — ED Provider Notes (Signed)
History  This chart was scribed for Gavin Pound. [name], MD by Bennett Scrape. This patient was seen in room MH05/MH05 and the patient's care was started at 6:03PM.  CSN: [id number]  Arrival date & time 03/25/12  1735   First MD Initiated Contact with Patient 03/25/12 1803      Chief Complaint  Patient presents with  . Headache     The history is provided by the patient. No language interpreter was used.    Stephanie Miranda is a 36 y.o. female who presents to the Emergency Department complaining of 24 hours of gradual onset, gradually worsening, constant occipitally located HA described as throbbing that radiates sharp pains down the neck with associated nausea and itching. The pain is worse with touch. She reports taking 800 mg of Motrin with no improvement in her symptoms. She also denies using any new products such as shampoo or hair spray. She was seen in this ED on 03/13/12 for the same symptoms and had a CT scan that showed a fatty mass in the neck. She was advised to follow up with her PCP to have a MRI performed in order to further investigate the mass. Pt has followed up with her PCP but is requesting a copy of the CT scan in order to clarify the need for a MRI. She states that she has an appointment in 3 days with a walk-in clinic. She denies dizziness, fever, chills and emesis as associated symptoms. She has h/o hypothyroid, goiter, and HIV.  She is a current everyday smoker but rarely uses alcohol.  Past Medical History  Diagnosis Date  . Obesity   . Hypothyroidism   . HIV (human immunodeficiency virus infection) December 2012    Past Surgical History  Procedure Date  . Hernia repair     History reviewed. No pertinent family history.  History  Substance Use Topics  . Smoking status: Current Everyday Smoker -- 1.0 packs/day for 25 years    Types: Cigarettes  . Smokeless tobacco: Not on file  . Alcohol Use: Yes     rarely    No OB history provided.  Review of Systems   Constitutional: Positive for fatigue. Negative for fever and chills.  HENT: Positive for neck pain. Negative for neck stiffness.   Gastrointestinal: Negative for nausea and vomiting.  Musculoskeletal: Negative for back pain.  Skin: Negative for rash.  Neurological: Positive for headaches. Negative for dizziness, syncope, weakness and numbness.  All other systems reviewed and are negative.    Allergies  Review of patient's allergies indicates no known allergies.  Home Medications   Current Outpatient Rx  Name Route Sig Dispense Refill  . STRIBILD PO Oral Take by mouth.    . FERROUS SULFATE 325 (65 FE) MG PO TABS Oral Take 325 mg by mouth daily with breakfast.      . IBUPROFEN 200 MG PO TABS Oral Take 800 mg by mouth every 6 (six) hours as needed. Patient used this medication for her headache.    Marland Kitchen LEVOTHYROXINE SODIUM 175 MCG PO TABS Oral Take 175 mcg by mouth daily.      Marland Kitchen OMEPRAZOLE MAGNESIUM 20 MG PO TBEC Oral Take 20 mg by mouth daily.     . ACETAMINOPHEN-CODEINE #3 300-30 MG PO TABS Oral Take 1-2 tablets by mouth every 6 (six) hours as needed for pain. 15 tablet 0  . ASPIRIN EC 81 MG PO TBEC Oral Take 81 mg by mouth daily.      Marland Kitchen  CEPHALEXIN 500 MG PO CAPS Oral Take 1 capsule (500 mg total) by mouth 3 (three) times daily. 21 capsule 0    Triage vitals: BP 125/63  Pulse 80  Temp 98.4 F (36.9 C) (Oral)  Resp 16  Ht 5\' 6"  (1.676 m)  Wt 210 lb (95.255 kg)  BMI 33.89 kg/m2  SpO2 100%  LMP 03/25/2012  Physical Exam  Nursing note and vitals reviewed. Constitutional: She is oriented to person, place, and time. She appears well-developed and well-nourished. No distress.  HENT:  Head: Normocephalic and atraumatic.  Eyes: Conjunctivae and EOM are normal. Pupils are equal, round, and reactive to light.  Neck: Trachea normal, normal range of motion and full passive range of motion without pain. Neck supple. No rigidity. No tracheal deviation and no erythema present.     Cardiovascular: Normal rate.   Pulmonary/Chest: Effort normal. No respiratory distress.  Musculoskeletal: Normal range of motion.  Lymphadenopathy:    She has no cervical adenopathy.  Neurological: She is alert and oriented to person, place, and time.  Skin: Skin is warm and dry. She is not diaphoretic.  Psychiatric: She has a normal mood and affect. Her behavior is normal.    ED Course  Procedures (including critical care time)  DIAGNOSTIC STUDIES: Oxygen Saturation is 100% on room air, normal by my interpretation.    COORDINATION OF CARE: 6:23- Discussed antibiotics to address pain and further imaging if symptoms do not improve.  Labs Reviewed - No data to display No results found.   1. Scalp pain       MDM  I personally performed the services described in this documentation, which was scribed in my presence. The recorded information has been reviewed and considered.   The patient is nontoxic in appearance, she is afebrile with no stiff neck or meningeal signs. I reviewed her CT scans from a few weeks ago. There indeed was a reading of a soft tissue abnormality behind her C5-C6 area that appears to be soft tissue. I cannot appreciate this on my physical exam. She does have some tenderness in the scalp area. I placed her on antibiotics and she knows to followup with her physicians at [facility] to consider MRI as recommended on her previous CT scan.      Gavin Pound. [name], MD 03/25/12 [phone]

## 2012-03-25 NOTE — ED Notes (Signed)
Pt reports headache that started yesterday unrelieved after taking Motrin 800mg .

## 2012-03-25 NOTE — Discharge Instructions (Signed)
 General Headache, Without Cause A general headache has no specific cause. These headaches are not life-threatening. They will not lead to other types of headaches. HOME CARE   Make and keep follow-up visits with your doctor.   Only take medicine as told by your doctor.   Try to relax, get a massage, or use your thoughts to control your body (biofeedback).   Apply cold or heat to the head and neck. Apply 3 or 4 times a day or as needed.  Finding out the results of your test Ask when your test results will be ready. Make sure you get your test results. GET HELP RIGHT AWAY IF:   You have problems with medicine.   Your medicine does not help relieve pain.   Your headache changes or becomes worse.   You feel sick to your stomach (nauseous) or throw up (vomit).   You have a temperature by mouth above 102 F (38.9 C), not controlled by medicine.   Your have a stiff neck.   You have vision loss.   You have muscle weakness.   You lose control of your muscles.   You lose balance or have trouble walking.   You feel like you are going to pass out (faint).  MAKE SURE YOU:   Understand these instructions.   Will watch this condition.   Will get help right away if you are not doing well or get worse.  Document Released: 06/06/2008 Document Revised: 08/17/2011 Document Reviewed: 06/06/2008 [facility] Patient Information 2012 [place], [place].

## 2012-05-05 ENCOUNTER — Emergency Department (HOSPITAL_BASED_OUTPATIENT_CLINIC_OR_DEPARTMENT_OTHER): Payer: Self-pay

## 2012-05-05 ENCOUNTER — Emergency Department (HOSPITAL_BASED_OUTPATIENT_CLINIC_OR_DEPARTMENT_OTHER)
Admission: EM | Admit: 2012-05-05 | Discharge: 2012-05-05 | Disposition: A | Discharge status: Home / Self Care | Payer: Self-pay | Attending: Emergency Medicine | Admitting: Emergency Medicine

## 2012-05-05 ENCOUNTER — Encounter (HOSPITAL_BASED_OUTPATIENT_CLINIC_OR_DEPARTMENT_OTHER): Payer: Self-pay | Admitting: *Deleted

## 2012-05-05 DIAGNOSIS — R0789 Other chest pain: Secondary | ICD-10-CM

## 2012-05-05 DIAGNOSIS — Z21 Asymptomatic human immunodeficiency virus [HIV] infection status: Secondary | ICD-10-CM | POA: Insufficient documentation

## 2012-05-05 DIAGNOSIS — F17200 Nicotine dependence, unspecified, uncomplicated: Secondary | ICD-10-CM | POA: Insufficient documentation

## 2012-05-05 DIAGNOSIS — E669 Obesity, unspecified: Secondary | ICD-10-CM | POA: Insufficient documentation

## 2012-05-05 HISTORY — DX: Thyrotoxicosis, unspecified without thyrotoxic crisis or storm: E05.90

## 2012-05-05 HISTORY — DX: Nontoxic goiter, unspecified: E04.9

## 2012-05-05 LAB — COMPREHENSIVE METABOLIC PANEL
ALT: 12 U/L (ref 0–35)
CO2: 26 mEq/L (ref 19–32)
Calcium: 10.4 mg/dL (ref 8.4–10.5)
GFR calc Af Amer: 66 mL/min — ABNORMAL LOW (ref >90)
GFR calc non Af Amer: 57 mL/min — ABNORMAL LOW (ref >90)
Glucose, Bld: 93 mg/dL (ref 70–99)
Sodium: 137 mEq/L (ref 135–145)

## 2012-05-05 LAB — CBC
Hemoglobin: 10.9 g/dL — ABNORMAL LOW (ref 12.0–15.0)
MCH: 26.5 pg (ref 26.0–34.0)
MCV: 80.1 fL (ref 78.0–100.0)
RBC: 4.12 MIL/uL (ref 3.87–5.11)

## 2012-05-05 LAB — APTT: aPTT: 31 seconds (ref 24–37)

## 2012-05-05 MED ORDER — HYDROCODONE-ACETAMINOPHEN 5-325 MG PO TABS
1.0000 | ORAL_TABLET | Freq: Once | ORAL | Status: AC
  Administered 2012-05-05: 1 via ORAL
  Filled 2012-05-05: qty 1

## 2012-05-05 MED ORDER — NAPROXEN 500 MG PO TABS: 500.0000 mg | ORAL_TABLET | Freq: Two times a day (BID) | ORAL | Status: DC

## 2012-05-05 MED ORDER — CYCLOBENZAPRINE HCL 5 MG PO TABS: 5.0000 mg | ORAL_TABLET | Freq: Three times a day (TID) | ORAL | Status: AC | PRN

## 2012-05-05 MED ORDER — HYDROCODONE-ACETAMINOPHEN 5-325 MG PO TABS: 1.0000 | ORAL_TABLET | Freq: Four times a day (QID) | ORAL | Status: AC | PRN

## 2012-05-05 MED ORDER — ASPIRIN 81 MG PO CHEW
324.0000 mg | CHEWABLE_TABLET | Freq: Once | ORAL | Status: AC
  Administered 2012-05-05: 324 mg via ORAL
  Filled 2012-05-05: qty 4

## 2012-05-05 MED ORDER — SODIUM CHLORIDE 0.9 % IV SOLN: 1000.0000 mL | INTRAVENOUS | Status: DC

## 2012-05-05 NOTE — ED Provider Notes (Signed)
History   This chart was scribed for Celene Kras, MD by Toya Smothers. The patient was seen in room MH03/MH03. Patient's care was started at 2015.  CSN: [id number]  Arrival date & time 05/05/12  2015   None     Chief Complaint  Patient presents with  . Chest Pain   The history is provided by the patient. No language interpreter was used.    Stephanie Miranda is a 36 y.o. female with a medical history of  HIV who presents to the ED complaining of chest pain and shoulder pain radiating down left arm pain onset 13 hours ago. Pain is described as a "crick", and has increased acutely. Pain is aggravated with movement. Prior to arrival at the ED, Pt took motrin with mild to no relief.Denies weakness, fever, and chills. Pt is a current everyday smoker.   Past Medical History  Diagnosis Date  . Obesity   . Hypothyroidism   . HIV (human immunodeficiency virus infection)   . Hyperthyroidism   . Goiter     Past Surgical History  Procedure Date  . Hernia repair     History reviewed. No pertinent family history.  History  Substance Use Topics  . Smoking status: Current Everyday Smoker -- 1.0 packs/day for 25 years    Types: Cigarettes  . Smokeless tobacco: Not on file  . Alcohol Use: Yes     rarely   Review of Systems  Respiratory: Positive for chest tightness.   Musculoskeletal: Positive for myalgias (L arm pain) and arthralgias (L shoulder pain).  All other systems reviewed and are negative.    Allergies  Review of patient's allergies indicates no known allergies.  Home Medications   Current Outpatient Rx  Name Route Sig Dispense Refill  . STRIBILD PO Oral Take 1 tablet by mouth daily.     Marland Kitchen FERROUS SULFATE 325 (65 FE) MG PO TABS Oral Take 325 mg by mouth daily with breakfast.      . IBUPROFEN 200 MG PO TABS Oral Take 800 mg by mouth every 8 (eight) hours as needed. For pain    . OMEPRAZOLE MAGNESIUM 20 MG PO TBEC Oral Take 20 mg by mouth daily.       BP 119/71  Pulse  62  Temp 98.6 F (37 C) (Oral)  Resp 16  Ht 5' 6.5" (1.689 m)  Wt 206 lb (93.441 kg)  BMI 32.75 kg/m2  SpO2 100%  LMP 04/16/2012  Physical Exam  Nursing note and vitals reviewed. Constitutional: She appears well-developed and well-nourished. No distress.  HENT:  Head: Normocephalic and atraumatic.  Right Ear: External ear normal.  Left Ear: External ear normal.  Eyes: Conjunctivae are normal. Right eye exhibits no discharge. Left eye exhibits no discharge. No scleral icterus.  Neck: Neck supple. No tracheal deviation present.  Cardiovascular: Normal rate, regular rhythm and intact distal pulses.   Pulmonary/Chest: Effort normal and breath sounds normal. No stridor. No respiratory distress. She has no wheezes. She has no rales.  Abdominal: Soft. Bowel sounds are normal. She exhibits no distension. There is no tenderness. There is no rebound and no guarding.  Musculoskeletal: She exhibits no edema and no tenderness.       L perispinal tenderness. Le periscapular regions. Mild pain with ROM.  Neurological: She is alert. She has normal strength. No sensory deficit. Cranial nerve deficit:  no gross defecits noted. She exhibits normal muscle tone. She displays no seizure activity. Coordination normal.  Skin: Skin is  warm and dry. No rash noted.  Psychiatric: She has a normal mood and affect.    ED Course  Procedures (including critical care time) DIAGNOSTIC STUDIES: Oxygen Saturation is 100% on room air, normal by my interpretation.    COORDINATION OF CARE: 2103- Ordered DG Chest 2 View 1 time imaging. 2103- Ordered CBC STAT 2103- Ordered Comprehensive metabolic panel STAT. 2103- Ordered Protime-INR STAT. 2103- Ordered APTT STAT. 2103- Ordered Troponin I Once. 2115- Ordered 0.9 % sodium chloride infusion Continuous. 2115- Ordered aspirin chewable tablet 324 mg Once. 2217-Evaluated Pt. Pt is awake, alert, and oriented. 2230- Ordered HYDROcodone-acetaminophen (NORCO/VICODIN) 5-325  MG per tablet 1 tablet Once.  Rate: 64  Rhythm: normal sinus rhythm  QRS Axis: normal  Intervals: normal  ST/T Wave abnormalities: normal  Conduction Disutrbances:none  Narrative Interpretation:   Old EKG Reviewed: none available   Labs Reviewed  CBC - Abnormal; Notable for the following:    Hemoglobin 10.9 (*)     HCT 33.0 (*)     RDW 17.9 (*)     All other components within normal limits  COMPREHENSIVE METABOLIC PANEL - Abnormal; Notable for the following:    Creatinine, Ser 1.20 (*)     Total Bilirubin 0.2 (*)     GFR calc non Af Amer 57 (*)     GFR calc Af Amer 66 (*)     All other components within normal limits  PROTIME-INR  APTT  TROPONIN I   Dg Chest 2 View  05/05/2012  *RADIOLOGY REPORT*  Clinical Data: Left side chest pain.  CHEST - 2 VIEW  Comparison: Plain films of the chest 08/08/2011.  Findings: Lungs are clear.  Heart size is normal.  No pneumothorax or pleural fluid.  No focal bony abnormality with S-shaped thoracolumbar scoliosis noted.  IMPRESSION: No acute abnormality.   Original Report Authenticated By: Bernadene Bell. D'ALESSIO, M.D.      1. Musculoskeletal chest pain       MDM  Patient appears to have chest wall pain. Her symptoms are musculoskeletal in nature. There is no evidence to suggest acute infection. Patient is low risk for PE and pertinent negative  I personally performed the services described in this documentation, which was scribed in my presence.  The recorded information has been reviewed and considered.    Celene Kras, MD 05/05/12 [phone]

## 2012-05-05 NOTE — ED Notes (Signed)
Pt states she was relaxing earlier and began having some pain in her left arm. Took Motrin, but no relief. Later noticed pain was moving into the left side of her head. Denies other s/s. EKG being done at triage.

## 2012-08-25 ENCOUNTER — Encounter (HOSPITAL_BASED_OUTPATIENT_CLINIC_OR_DEPARTMENT_OTHER): Payer: Self-pay | Admitting: *Deleted

## 2012-08-25 DIAGNOSIS — R079 Chest pain, unspecified: Secondary | ICD-10-CM | POA: Insufficient documentation

## 2012-08-25 DIAGNOSIS — E039 Hypothyroidism, unspecified: Secondary | ICD-10-CM | POA: Insufficient documentation

## 2012-08-25 DIAGNOSIS — K219 Gastro-esophageal reflux disease without esophagitis: Secondary | ICD-10-CM | POA: Insufficient documentation

## 2012-08-25 DIAGNOSIS — Z21 Asymptomatic human immunodeficiency virus [HIV] infection status: Secondary | ICD-10-CM | POA: Insufficient documentation

## 2012-08-25 DIAGNOSIS — I2699 Other pulmonary embolism without acute cor pulmonale: Principal | ICD-10-CM | POA: Insufficient documentation

## 2012-08-25 DIAGNOSIS — D649 Anemia, unspecified: Secondary | ICD-10-CM | POA: Insufficient documentation

## 2012-08-25 DIAGNOSIS — F17200 Nicotine dependence, unspecified, uncomplicated: Secondary | ICD-10-CM | POA: Insufficient documentation

## 2012-08-25 DIAGNOSIS — M79609 Pain in unspecified limb: Secondary | ICD-10-CM | POA: Insufficient documentation

## 2012-08-25 DIAGNOSIS — I82409 Acute embolism and thrombosis of unspecified deep veins of unspecified lower extremity: Secondary | ICD-10-CM | POA: Insufficient documentation

## 2012-08-25 NOTE — ED Notes (Addendum)
C/o left leg pain that is in her calf and radiates into her left thigh. Denies any injury. States pain is constant. States pain improved with rest. Denies any recent travel. Denies any sob at present.

## 2012-08-26 ENCOUNTER — Emergency Department (HOSPITAL_BASED_OUTPATIENT_CLINIC_OR_DEPARTMENT_OTHER): Payer: Self-pay

## 2012-08-26 ENCOUNTER — Observation Stay (HOSPITAL_BASED_OUTPATIENT_CLINIC_OR_DEPARTMENT_OTHER)
Admission: EM | Admit: 2012-08-26 | Discharge: 2012-08-26 | Disposition: A | Discharge status: Home / Self Care | Payer: Self-pay | Attending: Internal Medicine | Admitting: Internal Medicine

## 2012-08-26 ENCOUNTER — Encounter (HOSPITAL_COMMUNITY): Payer: Self-pay | Admitting: Internal Medicine

## 2012-08-26 DIAGNOSIS — R079 Chest pain, unspecified: Secondary | ICD-10-CM | POA: Diagnosis present

## 2012-08-26 DIAGNOSIS — I82409 Acute embolism and thrombosis of unspecified deep veins of unspecified lower extremity: Secondary | ICD-10-CM

## 2012-08-26 DIAGNOSIS — Z21 Asymptomatic human immunodeficiency virus [HIV] infection status: Secondary | ICD-10-CM | POA: Diagnosis present

## 2012-08-26 DIAGNOSIS — B20 Human immunodeficiency virus [HIV] disease: Secondary | ICD-10-CM

## 2012-08-26 DIAGNOSIS — I2699 Other pulmonary embolism without acute cor pulmonale: Secondary | ICD-10-CM | POA: Diagnosis present

## 2012-08-26 DIAGNOSIS — D649 Anemia, unspecified: Secondary | ICD-10-CM

## 2012-08-26 DIAGNOSIS — K219 Gastro-esophageal reflux disease without esophagitis: Secondary | ICD-10-CM | POA: Diagnosis present

## 2012-08-26 DIAGNOSIS — D509 Iron deficiency anemia, unspecified: Secondary | ICD-10-CM | POA: Diagnosis present

## 2012-08-26 DIAGNOSIS — Z720 Tobacco use: Secondary | ICD-10-CM | POA: Diagnosis present

## 2012-08-26 HISTORY — DX: Gastro-esophageal reflux disease without esophagitis: K21.9

## 2012-08-26 LAB — CBC WITH DIFFERENTIAL/PLATELET
Basophils Relative: 0 % (ref 0–1)
Eosinophils Absolute: 0.1 10*3/uL (ref 0.0–0.7)
Eosinophils Relative: 3 % (ref 0–5)
HCT: 27.9 % — ABNORMAL LOW (ref 36.0–46.0)
Hemoglobin: 9.1 g/dL — ABNORMAL LOW (ref 12.0–15.0)
MCH: 25.1 pg — ABNORMAL LOW (ref 26.0–34.0)
MCHC: 32.6 g/dL (ref 30.0–36.0)
MCV: 76.9 fL — ABNORMAL LOW (ref 78.0–100.0)
Monocytes Absolute: 0.4 10*3/uL (ref 0.1–1.0)
Monocytes Relative: 11 % (ref 3–12)
Neutrophils Relative %: 53 % (ref 43–77)

## 2012-08-26 LAB — BASIC METABOLIC PANEL
BUN: 10 mg/dL (ref 6–23)
Creatinine, Ser: 0.7 mg/dL (ref 0.50–1.10)
GFR calc Af Amer: >90 mL/min (ref >90)
GFR calc non Af Amer: >90 mL/min (ref >90)

## 2012-08-26 LAB — HOMOCYSTEINE: Homocysteine: 9.6 umol/L (ref 4.0–15.4)

## 2012-08-26 MED ORDER — PANTOPRAZOLE SODIUM 40 MG PO TBEC
40.0000 mg | DELAYED_RELEASE_TABLET | Freq: Every day | ORAL | Status: DC
  Administered 2012-08-26: 40 mg via ORAL
  Filled 2012-08-26: qty 1

## 2012-08-26 MED ORDER — ELVITEG-COBIC-EMTRICIT-TENOFDF 150-150-200-300 MG PO TABS
1.0000 | ORAL_TABLET | Freq: Every day | ORAL | Status: DC
  Filled 2012-08-26: qty 1

## 2012-08-26 MED ORDER — ZOLPIDEM TARTRATE 5 MG PO TABS: 5.0000 mg | ORAL_TABLET | Freq: Every evening | ORAL | Status: DC | PRN

## 2012-08-26 MED ORDER — ENOXAPARIN SODIUM 100 MG/ML ~~LOC~~ SOLN
1.0000 mg/kg | Freq: Once | SUBCUTANEOUS | Status: AC
  Administered 2012-08-26: 90 mg via SUBCUTANEOUS
  Filled 2012-08-26: qty 1

## 2012-08-26 MED ORDER — RIVAROXABAN 15 MG PO TABS: 15.0000 mg | ORAL_TABLET | Freq: Two times a day (BID) | ORAL | Status: DC

## 2012-08-26 MED ORDER — WARFARIN - PHARMACIST DOSING INPATIENT: Freq: Every day | Status: DC

## 2012-08-26 MED ORDER — HYDROCODONE-ACETAMINOPHEN 5-325 MG PO TABS: 1.0000 | ORAL_TABLET | ORAL | Status: DC | PRN

## 2012-08-26 MED ORDER — PATIENT'S GUIDE TO USING COUMADIN BOOK
Freq: Once | Status: AC
  Administered 2012-08-26: 10:00:00
  Filled 2012-08-26: qty 1

## 2012-08-26 MED ORDER — ENOXAPARIN SODIUM 100 MG/ML ~~LOC~~ SOLN
90.0000 mg | Freq: Two times a day (BID) | SUBCUTANEOUS | Status: DC
  Filled 2012-08-26 (×2): qty 1

## 2012-08-26 MED ORDER — IOHEXOL 350 MG/ML SOLN
80.0000 mL | Freq: Once | INTRAVENOUS | Status: AC | PRN
  Administered 2012-08-26: 80 mL via INTRAVENOUS

## 2012-08-26 MED ORDER — ELVITEG-COBIC-EMTRICIT-TENOFDF 150-150-200-300 MG PO TABS
1.0000 | ORAL_TABLET | Freq: Once | ORAL | Status: DC
  Filled 2012-08-26: qty 1

## 2012-08-26 MED ORDER — SODIUM CHLORIDE 0.9 % IJ SOLN
3.0000 mL | Freq: Two times a day (BID) | INTRAMUSCULAR | Status: DC
  Administered 2012-08-26: 3 mL via INTRAVENOUS

## 2012-08-26 MED ORDER — FERROUS SULFATE 325 (65 FE) MG PO TABS
325.0000 mg | ORAL_TABLET | Freq: Every day | ORAL | Status: DC
  Administered 2012-08-26: 325 mg via ORAL
  Filled 2012-08-26 (×2): qty 1

## 2012-08-26 MED ORDER — ELVITEG-COBIC-EMTRICIT-TENOFDF 150-150-200-300 MG PO TABS
1.0000 | ORAL_TABLET | Freq: Every day | ORAL | Status: DC
  Administered 2012-08-26: 1 via ORAL
  Filled 2012-08-26: qty 1

## 2012-08-26 MED ORDER — WARFARIN SODIUM 7.5 MG PO TABS
7.5000 mg | ORAL_TABLET | Freq: Once | ORAL | Status: DC
  Filled 2012-08-26: qty 1

## 2012-08-26 MED ORDER — SODIUM CHLORIDE 0.9 % IJ SOLN: 3.0000 mL | INTRAMUSCULAR | Status: DC | PRN

## 2012-08-26 MED ORDER — RIVAROXABAN 15 MG PO TABS
15.0000 mg | ORAL_TABLET | Freq: Two times a day (BID) | ORAL | Status: DC
  Administered 2012-08-26: 15 mg via ORAL
  Filled 2012-08-26 (×2): qty 1

## 2012-08-26 MED ORDER — LEVOTHYROXINE SODIUM 175 MCG PO TABS
175.0000 ug | ORAL_TABLET | Freq: Every day | ORAL | Status: DC
  Filled 2012-08-26: qty 1

## 2012-08-26 MED ORDER — RIVAROXABAN 20 MG PO TABS: 20.0000 mg | ORAL_TABLET | Freq: Every day | ORAL | Status: DC

## 2012-08-26 MED ORDER — OMEPRAZOLE MAGNESIUM 20 MG PO TBEC: 20.0000 mg | DELAYED_RELEASE_TABLET | Freq: Every day | ORAL | Status: DC

## 2012-08-26 MED ORDER — ONDANSETRON HCL 4 MG PO TABS: 4.0000 mg | ORAL_TABLET | Freq: Four times a day (QID) | ORAL | Status: DC | PRN

## 2012-08-26 MED ORDER — IBUPROFEN 800 MG PO TABS
800.0000 mg | ORAL_TABLET | Freq: Three times a day (TID) | ORAL | Status: DC | PRN
  Filled 2012-08-26: qty 1

## 2012-08-26 MED ORDER — SODIUM CHLORIDE 0.9 % IV SOLN: 250.0000 mL | INTRAVENOUS | Status: DC | PRN

## 2012-08-26 MED ORDER — WARFARIN VIDEO: Freq: Once | Status: DC

## 2012-08-26 MED ORDER — SODIUM CHLORIDE 0.9 % IJ SOLN
3.0000 mL | Freq: Two times a day (BID) | INTRAMUSCULAR | Status: DC
  Administered 2012-08-26 (×2): 3 mL via INTRAVENOUS

## 2012-08-26 MED ORDER — ONDANSETRON HCL 4 MG/2ML IJ SOLN: 4.0000 mg | Freq: Four times a day (QID) | INTRAMUSCULAR | Status: DC | PRN

## 2012-08-26 NOTE — Progress Notes (Signed)
VASCULAR LAB PRELIMINARY  PRELIMINARY  PRELIMINARY  PRELIMINARY  Bilateral lower extremity venous duplex completed.    Preliminary report:  Right:  No evidence of DVT, superficial thrombosis, or Baker's cyst.  Left: Non occlusive DVT noted in the proximal PTV.  No evidence of superficial thrombosis.  No Baker's cyst.  Naomi Castrogiovanni, RVT 08/26/2012, 11:11 AM

## 2012-08-26 NOTE — ED Notes (Signed)
Pt assigned to room 3W32 @ [facility], RN aware.

## 2012-08-26 NOTE — H&P (Signed)
[facility] History and Physical  WAVIE HASHIMI ZOX:[id number] DOB: 08-31-1976 DOA: 08/26/2012  Referring physician:  PCP: No primary provider on file.  Specialists:   Chief Complaint: left leg pain  HPI: Stephanie Miranda is a very pleasant  36 y.o. female with past medical history including HIV diagnosed 12/12, hypothyroidism, GERD who presented to [facility] last evening with cc worsening left leg pain with sob. Information obtained from patient. She reports left leg pain for 1 week. States the pain started in left calf and progressed to back of knee and then the thigh hip area. Describes the pain as cramp like and rates it 8/10. She took advil and asa without relief. Three days ago developed chest tightness in epigastric area with sob with exertion. She also experienced dry non-productive cough. She denies LEE or erythema or warmth to touch.  She denies fever, chills, n/v/diarrhea, abdominal pain, recent travel. She states that she is on her feet a lot with her job just standing. Symptoms came on gradually have persisted nothing makes better and palpation and walking makes worse. Symptoms characterized as moderate. Work up in ED yield elevated d-dimer and CT angio chest +PE. We are asked to admit.   Review of Systems: The patient denies fever, vision loss, decreased hearing, hoarseness,  syncope,  peripheral edema, balance deficits, hemoptysis, abdominal pain, melena, hematochezia, hematuria, incontinence, genital sores, muscle weakness, suspicious skin lesions, transient blindness, depression,  abnormal bleeding, enlarged lymph nodes, angioedema, and breast masses.    Past Medical History  Diagnosis Date  . Obesity   . Hypothyroidism   . HIV (human immunodeficiency virus infection)   . Hyperthyroidism   . Goiter    Past Surgical History  Procedure Date  . Hernia repair    Social History:  reports that she has been smoking Cigarettes.  She has a 25 pack-year smoking history.  She does not have any smokeless tobacco history on file. She reports that she drinks alcohol. She reports that she does not use illicit drugs. Pt lives at home is employed at [employer]	[place] and part-time care giver private duty.   No Known Allergies  No family history on file. Mother 55 yo with HTN, Bells Palsy hx stroke. Father 30 yo with HTN. One sister with Bells Palsy  Prior to Admission medications   Medication Sig Start Date End Date Taking? Authorizing Provider  Elviteg-Cobicis-Emtricit-Tenof (STRIBILD PO) Take 1 tablet by mouth daily.     Historical Provider, MD  ferrous sulfate 325 (65 FE) MG tablet Take 325 mg by mouth daily with breakfast.      Historical Provider, MD  ibuprofen (ADVIL,MOTRIN) 200 MG tablet Take 800 mg by mouth every 8 (eight) hours as needed. For pain    Historical Provider, MD  naproxen (NAPROSYN) 500 MG tablet Take 1 tablet (500 mg total) by mouth 2 (two) times daily. 05/05/12 05/05/13  Celene Kras, MD  omeprazole (PRILOSEC OTC) 20 MG tablet Take 20 mg by mouth daily.     Historical Provider, MD   Physical Exam: Filed Vitals:   08/26/12 0429 08/26/12 0430 08/26/12 0528 08/26/12 0645  BP:  115/66 114/77 103/69  Pulse: 62 61  99  Temp:    98.7 F (37.1 C)  TempSrc:    Oral  Resp:   18 16  Height:    5' 6.5" (1.689 m)  Weight:    89.404 kg (197 lb 1.6 oz)  SpO2:  100% 98% 98%  General:  Awake alert oriented pleasant cooperative  Eyes: PERRL EOMI no scleral icterus  ENT: ears clear, nose without drainage. Mucus membranes moist pink  Neck: supple No JVD No lymphadenopathy  Cardiovascular: RRR No MGR No LEE PPP  Respiratory: normal effort BSCTAB No wheeze  Abdomen: round soft +BS non-tender to palpation  Skin: warm dry no rash/lesion  Musculoskeletal: MOE No joint swelling/erythema. No LEE. Left calf, posterior knee and thigh tender to palpation. No warmth to touch  Psychiatric: cooperative, appropriate  Neurologic: cranial nerve  II-XII intact speech clear. Facial symmetry.   Labs on Admission:  Basic Metabolic Panel:  Lab 08/26/12 1610  NA 138  K 3.8  CL 106  CO2 24  GLUCOSE 106*  BUN 10  CREATININE 0.70  CALCIUM 10.4  MG --  PHOS --   Liver Function Tests: No results found for this basename: AST:5,ALT:5,ALKPHOS:5,BILITOT:5,PROT:5,ALBUMIN:5 in the last 168 hours No results found for this basename: LIPASE:5,AMYLASE:5 in the last 168 hours No results found for this basename: AMMONIA:5 in the last 168 hours CBC:  Lab 08/26/12 0048  WBC 3.7*  NEUTROABS 2.0  HGB 9.1*  HCT 27.9*  MCV 76.9*  PLT 143*   Cardiac Enzymes:  Lab 08/26/12 0048  CKTOTAL --  CKMB --  CKMBINDEX --  TROPONINI <0.30    BNP (last 3 results) No results found for this basename: PROBNP:3 in the last 8760 hours CBG: No results found for this basename: GLUCAP:5 in the last 168 hours  Radiological Exams on Admission: Dg Chest 2 View  08/26/2012  *RADIOLOGY REPORT*  Clinical Data: Chest pain  CHEST - 2 VIEW  Comparison: 05/05/2012  Findings: Increased opacity of the left greater than right lung bases.  No definite pleural effusion or pneumothorax.  Mildly tortuous aorta without aneurysmal dilatation. Upper normal heart size.  No acute osseous finding.  IMPRESSION: Mild left greater than right lung base opacities; atelectasis versus infiltrate.   Original Report Authenticated By: Jearld Lesch, M.D.    Ct Angio Chest W/cm &/or Wo Cm  08/26/2012  *RADIOLOGY REPORT*  Clinical Data: Chest pain, elevated D-dimer.  CT ANGIOGRAPHY CHEST  Technique:  Multidetector CT imaging of the chest using the standard protocol during bolus administration of intravenous contrast. Multiplanar reconstructed images including MIPs were obtained and reviewed to evaluate the vascular anatomy.  Contrast: 80mL OMNIPAQUE IOHEXOL 350 MG/ML SOLN  Comparison: 11/25/2010  Findings: Right lower lobe pulmonary embolism within the segmental branch.  Normal caliber  aorta and branch vessels.  Heart size upper normal.  No CT evidence for right ventricular heart strain.  No pleural or pericardial effusion.  Limited images through the upper abdomen show multiple water attenuation hepatic hypodensities, most in keeping with biliary cysts.  No intrathoracic lymphadenopathy.  Central airways are patent. Mild bibasilar opacities, most in keeping with atelectasis.  No pneumothorax.  No acute osseous finding.  Enlarged/heterogeneous attenuation of the right lobe of the thyroid gland.  IMPRESSION: Right lower lobe segmental branch pulmonary embolism.  Subsegmental bibasilar atelectasis.  Enlarged/heterogeneous attenuation of the right lobe of the thyroid gland can be further evaluated with a non emergent ultrasound.  Critical Value/emergent results were called by telephone at the time of interpretation on 08/26/2012 at 03:50 a.m. to Dr. Nicanor Alcon, who verbally acknowledged these results.   Original Report Authenticated By: Jearld Lesch, M.D.     EKG: Independently reviewed.   Assessment/Plan Active Problems:  Pulmonary embolism: per CT angio. Admit to tele for obs. Lovenox and coumadin  per pharmacy. Coag panel pending. Likely home tomorrow with long term anticoag  Chest pain/ bilateral LE pain: related to #1. Will check dopplers of bilateral LE. See #1.  HIV: stable at baseline. Continue home med. Se[facility] Anemia: microcytic. likely related to chronic disease. Continue home iron.  GERD: stable at baseline. Continue home PPI Tobacco abuse: counseled cessation. Nicotene Hypothyroidism: check TSH. Continue home synthroid. Will need OP follow up to abnormal CT yielding enlarged right lobe of thyroid gland. May need [place]    Code Status: full Family Communication: pt at bedside Disposition Plan: 1 day (indicate anticipated LOS)  Time spent: 55 minutes  Gwenyth Bender   NP [facility]   If 7PM-7AM, please contact  night-coverage [email or web address] Password [email or web address] 08/26/2012, 7:52 AM

## 2012-08-26 NOTE — ED Notes (Signed)
[facility] here to transport pt. To [facility]. Pt. Stable at d/c. Sinus on tele. VSS.

## 2012-08-26 NOTE — ED Notes (Signed)
Returned from CT.

## 2012-08-26 NOTE — Progress Notes (Addendum)
ANTICOAGULATION CONSULT NOTE - Initial Consult  Pharmacy Consult for Lovenox/Coumadin Indication: pulmonary embolus  No Known Allergies  Patient Measurements: Height: 5' 6.5" (168.9 cm) Weight: 197 lb 1.6 oz (89.404 kg) IBW/kg (Calculated) : 60.45   Vital Signs: Temp: 98.7 F (37.1 C) (12/16 0645) Temp src: Oral (12/16 0645) BP: 103/69 mmHg (12/16 0645) Pulse Rate: 99  (12/16 0645)  Labs:  Basename 08/26/12 0048  HGB 9.1*  HCT 27.9*  PLT 143*  APTT --  LABPROT --  INR --  HEPARINUNFRC --  CREATININE 0.70  CKTOTAL --  CKMB --  TROPONINI <0.30    Estimated Creatinine Clearance: 108.5 ml/min (by C-G formula based on Cr of 0.7).   Medical History: Past Medical History  Diagnosis Date  . Obesity   . Hypothyroidism   . HIV (human immunodeficiency virus infection)   . Hyperthyroidism   . Goiter   . GERD (gastroesophageal reflux disease)     Assessment: 23 YOF admitted for acute PE. Pharmacy is consulted to start lovenox bridge to coumadin. First dose lovenox 90mg  was given this morning at 0444. Pt. Renal function wnl. Not on anticoagulation prior to admission, baseline INR 0.94, Hgb 9.1, Plt 143   Goal of Therapy:  INR 2-3 Monitor platelets by anticoagulation protocol: Yes   Plan:  Lovenox 90mg  sq Q 12hrs next dose 1800 Coumadin 7.5mg  PO x 1 Daily PT/INR Coumadin education book and video  Bayard Hugger, PharmD, BCPS  Clinical Pharmacist  Pager: [phone]  08/26/2012,8:31 AM   Addendum: Pt. is to be discharged home on xarelto. Pt's renal and liver function are both within normal limit. Pt. Is also on Stribild, one of the component cobicisat is a strong CYP3A4 inhibitor which theoretically could increase the AUC of xarelto, however no clinical data on increase in bleeding risk. Discussed the anticoagulation options extensively with team. Given warfarin carries similar drug interactions with enzyme inhibitors, and risk of bleeding without adequate INR  monitoring. xarelto would still be best option for Ms Rubye Oaks.   Plan: D/C lovenox and coumadin Xarelto 15mg  bid with meals start this afternoon at 1600 Xarelto 15mg  bid for 21 days then change dose to 20mg  daily Will educate pt

## 2012-08-26 NOTE — ED Notes (Signed)
Transported to xray 

## 2012-08-26 NOTE — Progress Notes (Signed)
UR Completed Davie Claud Graves-Bigelow, RN,BSN [phone]  

## 2012-08-26 NOTE — Discharge Summary (Signed)
Physician Discharge Summary  Stephanie Miranda WJX:[id number] DOB: [birth year] DOA: 08/26/2012  PCP: No primary provider on file.  Admit date: 08/26/2012 Discharge date: 08/26/2012  Time spent: 40 minutes  Recommendations for Outpatient Follow-up:  1. Being discharged to home on xeralto and has appointment with [facility] at the Urgent care. 2. Recommend OP [place] of thyroid given abnormal CT  Discharge Diagnoses:  Principal Problem:  *Pulmonary embolism Active Problems:  Chest pain  HIV (human immunodeficiency virus infection)  Anemia  Tobacco abuse  GERD (gastroesophageal reflux disease)  DVT (deep venous thrombosis)   Discharge Condition: stabel  Diet recommendation: regular  Filed Weights   08/25/12 2156 08/26/12 0645  Weight: 90.266 kg (199 lb) 89.404 kg (197 lb 1.6 oz)    History of present illness:  Stephanie Miranda is a very pleasant 36 y.o. female with past medical history including HIV diagnosed 12/12, hypothyroidism, GERD who presented to [facility] 08/22/12 with cc worsening left leg pain with sob. Information obtained from patient. She reported left leg pain for 1 week. Stated the pain started in left calf and progressed to back of knee and then the thigh hip area. Described the pain as cramp like and rated it 8/10. She took advil and asa without relief. Three days after onset of left leg pain, developed chest tightness in epigastric area with sob with exertion. She also experienced dry non-productive cough. She denied LEE or erythema or warmth to touch. She denied fever, chills, n/v/diarrhea, abdominal pain, recent travel. She stated that she is on her feet a lot with her job just standing. Symptoms came on gradually have persisted nothing makes better and palpation and walking makes worse. Symptoms characterized as moderate. Work up in ED yield elevated d-dimer and CT angio chest +PE. We are asked to admit.   Hospital Course:  Pulmonary embolism/ DVT: per CT  angio and LE doppler. Admited to tele for obs. Lovenox and coumadin per pharmacy initially. Changed to xeralto for home.  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 Will continue on daily xeralto and will follow up with [facility].   Chest pain/ bilateral LE pain: related to #1. Improved at discharge. Doppler + Left DVT. See #1.   HIV: stable at baseline. Continue home med. Sees MD at [facility]   Anemia: microcytic. likely related to chronic disease. Continue home iron.   GERD: stable at baseline. Continue home PPI   Tobacco abuse: counseled cessation. Nicotene patch  Hypothyroidism: TSH in process at discharge. Continue home synthroid. Recommend OP follow up with [facility]gent Care. CT yielding enlarged right lobe of thyroid gland. May need [place]      Procedures:  Bilateral LE doppler  Consultations:  none  Discharge Exam: Filed Vitals:   08/26/12 0429 08/26/12 0430 08/26/12 0528 08/26/12 0645  BP:  115/66 114/77 103/69  Pulse: 62 61  99  Temp:    98.7 F (37.1 C)  TempSrc:    Oral  Resp:   18 16  Height:    5' 6.5" (1.689 m)  Weight:    89.404 kg (197 lb 1.6 oz)  SpO2:  100% 98% 98%    General: awake alert oriented x3 Cardiovascular: RRR No MGR No LEE Respiratory: normal effort BSCTAB No wheeze  Discharge Instructions  Discharge Orders    Future Orders Please Complete By Expires   Diet - low sodium heart healthy  Increase activity slowly      Call MD for:  difficulty breathing, headache or visual disturbances      Call MD for:  persistant dizziness or light-headedness          Medication List     As of 08/26/2012  1:12 PM    TAKE these medications         ferrous sulfate 325 (65 FE) MG tablet   Take 325 mg by mouth daily with breakfast.      HYDROcodone-acetaminophen 5-325 MG per tablet   Commonly known as: NORCO/VICODIN   Take 1-2 tablets by mouth every 4 (four) hours as needed.       ibuprofen 200 MG tablet   Commonly known as: ADVIL,MOTRIN   Take 800 mg by mouth every 8 (eight) hours as needed. For pain      levothyroxine 175 MCG tablet   Commonly known as: SYNTHROID, LEVOTHROID   Take 175 mcg by mouth daily.      naproxen 500 MG tablet   Commonly known as: NAPROSYN   Take 1 tablet (500 mg total) by mouth 2 (two) times daily.      omeprazole 20 MG tablet   Commonly known as: PRILOSEC OTC   Take 20 mg by mouth daily.      Rivaroxaban 15 MG Tabs tablet   Commonly known as: XARELTO   Take 1 tablet (15 mg total) by mouth 2 (two) times daily with a meal.      Rivaroxaban 20 MG Tabs   Commonly known as: XARELTO   Take 1 tablet (20 mg total) by mouth daily with supper.      STRIBILD PO   Take 1 tablet by mouth daily.          The results of significant diagnostics from this hospitalization (including imaging, microbiology, ancillary and laboratory) are listed below for reference.    Significant Diagnostic Studies: Dg Chest 2 View  08/26/2012  *RADIOLOGY REPORT*  Clinical Data: Chest pain  CHEST - 2 VIEW  Comparison: 05/05/2012  Findings: Increased opacity of the left greater than right lung bases.  No definite pleural effusion or pneumothorax.  Mildly tortuous aorta without aneurysmal dilatation. Upper normal heart size.  No acute osseous finding.  IMPRESSION: Mild left greater than right lung base opacities; atelectasis versus infiltrate.   Original Report Authenticated By: Jearld Lesch, M.D.    Ct Angio Chest W/cm &/or Wo Cm  08/26/2012  *RADIOLOGY REPORT*  Clinical Data: Chest pain, elevated D-dimer.  CT ANGIOGRAPHY CHEST  Technique:  Multidetector CT imaging of the chest using the standard protocol during bolus administration of intravenous contrast. Multiplanar reconstructed images including MIPs were obtained and reviewed to evaluate the vascular anatomy.  Contrast: 80mL OMNIPAQUE IOHEXOL 350 MG/ML SOLN  Comparison: 11/25/2010  Findings: Right  lower lobe pulmonary embolism within the segmental branch.  Normal caliber aorta and branch vessels.  Heart size upper normal.  No CT evidence for right ventricular heart strain.  No pleural or pericardial effusion.  Limited images through the upper abdomen show multiple water attenuation hepatic hypodensities, most in keeping with biliary cysts.  No intrathoracic lymphadenopathy.  Central airways are patent. Mild bibasilar opacities, most in keeping with atelectasis.  No pneumothorax.  No acute osseous finding.  Enlarged/heterogeneous attenuation of the right lobe of the thyroid gland.  IMPRESSION: Right lower lobe segmental branch pulmonary embolism.  Subsegmental bibasilar atelectasis.  Enlarged/heterogeneous attenuation of the right lobe of the thyroid gland can be  further evaluated with a non emergent ultrasound.  Critical Value/emergent results were called by telephone at the time of interpretation on 08/26/2012 at 03:50 a.m. to Dr. Nicanor Alcon, who verbally acknowledged these results.   Original Report Authenticated By: Jearld Lesch, M.D.     Microbiology: No results found for this or any previous visit (from the past 240 hour(s)).   Labs: Basic Metabolic Panel:  Lab 08/26/12 4540  NA 138  K 3.8  CL 106  CO2 24  GLUCOSE 106*  BUN 10  CREATININE 0.70  CALCIUM 10.4  MG --  PHOS --   Liver Function Tests: No results found for this basename: AST:5,ALT:5,ALKPHOS:5,BILITOT:5,PROT:5,ALBUMIN:5 in the last 168 hours No results found for this basename: LIPASE:5,AMYLASE:5 in the last 168 hours No results found for this basename: AMMONIA:5 in the last 168 hours CBC:  Lab 08/26/12 0048  WBC 3.7*  NEUTROABS 2.0  HGB 9.1*  HCT 27.9*  MCV 76.9*  PLT 143*   Cardiac Enzymes:  Lab 08/26/12 0048  CKTOTAL --  CKMB --  CKMBINDEX --  TROPONINI <0.30   BNP: BNP (last 3 results) No results found for this basename: PROBNP:3 in the last 8760 hours CBG: No results found for this basename:  GLUCAP:5 in the last 168 hours     Signed:  Gwenyth Bender  [facility] 08/26/2012, 1:12 PM

## 2012-08-26 NOTE — ED Notes (Signed)
Transported to CT 

## 2012-08-26 NOTE — ED Notes (Signed)
I placed patient on a five lead/wall monitor, then took vitals.

## 2012-08-26 NOTE — ED Notes (Signed)
Returned from xray

## 2012-08-26 NOTE — ED Notes (Signed)
While doing this RN's initial assessment, pt also stated that she was having some chest "pressure" and SHOB as well as "something you might describe as palpitations". BBS-clr. HSR.

## 2012-08-26 NOTE — Care Management Note (Addendum)
    Page 1 of 1   08/26/2012     12:27:05 PM   CARE MANAGEMENT NOTE 08/26/2012  Patient:  Stephanie Miranda, Stephanie Miranda   Account Number:  [id number]  Date Initiated:  08/26/2012  Documentation initiated by:  GRAVES-BIGELOW,Jamika Sadek  Subjective/Objective Assessment:   Pt admitted with +PE and lovenox /coumadin bridge for pt.     Action/Plan:   CM did try to speak to pt and she is in a test now. CM will see if xarelto is a better choice for pt to be d/c on. Dr. Benjamine Mola in process of sigining pt assistance forms for medication xarelto is pt chooses this route.   Anticipated DC Date:  08/26/2012   Anticipated DC Plan:  HOME/SELF CARE      DC Planning Services  CM consult  CM consult      Choice offered to / List presented to:             Status of service:  Completed, signed off Medicare Important Message given?   (If response is "NO", the following Medicare IM given date fields will be blank) Date Medicare IM given:   Date Additional Medicare IM given:    Discharge Disposition:  HOME/SELF CARE  Per UR Regulation:  Reviewed for med. necessity/level of care/duration of stay  If discussed at Long Length of Stay Meetings, dates discussed:    Comments:  08-26-12 1219 Tomi Bamberger, RN,BSN [phone] Pt is agreealbe to being d/c on Xarelto. CM did place a call to [facility] patient assistance program. CM will fax information to company and hopefully turn around time for approval will be within the next 3 business days per rep. Pt has not filed taxes in the last 3 years. CM will fax the paper work [facility] has available. CM will supply pt with a xarelto card for 10 day supply. MD please write order for 10 day supply of xarelto. CM will make f/u appointment at the [facility] at the Urgent care. No further needs from CM at this time. Will continue to f/u.

## 2012-08-26 NOTE — ED Provider Notes (Signed)
History  This chart was scribed for Gissel Keilman Smitty Cords, MD by Shari Heritage, ED Scribe. The patient was seen in room MH02/MH02. Patient's care was started at 0107.  CSN: [id number]  Arrival date & time 08/25/12  2146   First MD Initiated Contact with Patient 08/26/12 0107      Chief Complaint  Patient presents with  . left leg pain     Patient is a 36 y.o. female presenting with leg pain and chest pain. The history is provided by the patient. No language interpreter was used.  Leg Pain  The incident occurred more than 1 week ago. The incident occurred at home. There was no injury mechanism. The pain is present in the left leg. The pain is moderate. The pain has been constant since onset. Pertinent negatives include no numbness, no loss of motion, no muscle weakness and no loss of sensation. She reports no foreign bodies present. The symptoms are aggravated by bearing weight. She has tried NSAIDs for the symptoms. The treatment provided mild relief.  Chest Pain The chest pain began 1 - 2 weeks ago. Chest pain occurs constantly. The chest pain is unchanged. The severity of the pain is severe. The quality of the pain is described as sharp. The pain does not radiate. Exacerbated by: palpation. Pertinent negatives for primary symptoms include no shortness of breath, no cough, no nausea and no vomiting.  Pertinent negatives for associated symptoms include no numbness. She tried NSAIDs for the symptoms.  Pertinent negatives for past medical history include no CAD and no recent injury.     HPI Comments: JULIZA MACHNIK is a 36 y.o. female who presents to the Emergency Department complaining of constant, sharp, moderate, left leg pain that originates in her calf and radiates up to her thigh onset 1.5 weeks ago. Patient denies any obvious injury or trauma. Patient is also reporting constant, non-radiating, chest pain onset 1.5 weeks ago. Patient has been taking ibuprofen and goody powder at home  with minimal relief. Patient denies any long car or plane trips. Patient does not have a PCP in [place], but sees a physician following her for HIV positive status at [facility]. She has not told her physician at [facility] about her current symptoms. Patient is a current every day smoker.   Past Medical History  Diagnosis Date  . Obesity   . Hypothyroidism   . HIV (human immunodeficiency virus infection)   . Hyperthyroidism   . Goiter     Past Surgical History  Procedure Date  . Hernia repair     No family history on file.  History  Substance Use Topics  . Smoking status: Current Every Day Smoker -- 1.0 packs/day for 25 years    Types: Cigarettes  . Smokeless tobacco: Not on file  . Alcohol Use: Yes     Comment: rarely    OB History    Grav Para Term Preterm Abortions TAB SAB Ect Mult Living                  Review of Systems  Respiratory: Negative for cough and shortness of breath.   Cardiovascular: Positive for chest pain. Negative for leg swelling.  Gastrointestinal: Negative for nausea and vomiting.  Musculoskeletal:       Positive for left leg pain.  Neurological: Negative for numbness.  All other systems reviewed and are negative.    Allergies  Review of patient's allergies indicates no known allergies.  Home Medications   Current Outpatient  Rx  Name  Route  Sig  Dispense  Refill  . STRIBILD PO   Oral   Take 1 tablet by mouth daily.          Marland Kitchen FERROUS SULFATE 325 (65 FE) MG PO TABS   Oral   Take 325 mg by mouth daily with breakfast.           . IBUPROFEN 200 MG PO TABS   Oral   Take 800 mg by mouth every 8 (eight) hours as needed. For pain         . NAPROXEN 500 MG PO TABS   Oral   Take 1 tablet (500 mg total) by mouth 2 (two) times daily.   30 tablet   0   . OMEPRAZOLE MAGNESIUM 20 MG PO TBEC   Oral   Take 20 mg by mouth daily.            Triage Vitals: BP 118/76  Pulse 77  Temp 98.7 F (37.1 C) (Oral)  Resp 20  Ht 5' 6.5" (1.689 m)   Wt 199 lb (90.266 kg)  BMI 31.64 kg/m2  SpO2 100%  LMP 08/03/2012  Physical Exam  Constitutional: She is oriented to person, place, and time. She appears well-developed and well-nourished. No distress.  HENT:  Head: Normocephalic and atraumatic.  Mouth/Throat: No oropharyngeal exudate.  Eyes: EOM are normal. Pupils are equal, round, and reactive to light.  Neck: Neck supple.  Cardiovascular: Normal rate and normal heart sounds.   No murmur heard. Pulmonary/Chest: Effort normal and breath sounds normal. No respiratory distress. She has no wheezes. She has no rales. She exhibits tenderness.  Abdominal: Soft. Bowel sounds are normal. She exhibits no distension and no mass. There is no tenderness. There is no rebound and no guarding.  Musculoskeletal: Normal range of motion. She exhibits no edema.       Sensation intact to all nerve distributions. Intact L5/s1. Intact achilles tendon. Intact patellar reflex. Pain over the hip bursa. Intact dorsalis pedis.  No swelling or the calf no homan's sign  Neurological: She is alert and oriented to person, place, and time.  Skin: Skin is warm and dry. No rash noted. She is not diaphoretic.  Psychiatric: She has a normal mood and affect. Her behavior is normal.    ED Course  Procedures (including critical care time) DIAGNOSTIC STUDIES: Oxygen Saturation is 100% on room air, normal by my interpretation.    COORDINATION OF CARE: 1:21 AM- Patient informed of current plan for treatment and evaluation and agrees with plan at this time.  Results for orders placed during the hospital encounter of 08/26/12  CBC WITH DIFFERENTIAL      Component Value Range   WBC 3.7 (*) 4.0 - 10.5 K/uL   RBC 3.63 (*) 3.87 - 5.11 MIL/uL   Hemoglobin 9.1 (*) 12.0 - 15.0 g/dL   HCT 16.1 (*) 09.6 - 04.5 %   MCV 76.9 (*) 78.0 - 100.0 fL   MCH 25.1 (*) 26.0 - 34.0 pg   MCHC 32.6  30.0 - 36.0 g/dL   RDW 40.9 (*) 81.1 - 91.4 %   Platelets 143 (*) 150 - 400 K/uL    Neutrophils Relative 53  43 - 77 %   Neutro Abs 2.0  1.7 - 7.7 K/uL   Lymphocytes Relative 33  12 - 46 %   Lymphs Abs 1.2  0.7 - 4.0 K/uL   Monocytes Relative 11  3 - 12 %   Monocytes  Absolute 0.4  0.1 - 1.0 K/uL   Eosinophils Relative 3  0 - 5 %   Eosinophils Absolute 0.1  0.0 - 0.7 K/uL   Basophils Relative 0  0 - 1 %   Basophils Absolute 0.0  0.0 - 0.1 K/uL  BASIC METABOLIC PANEL      Component Value Range   Sodium 138  135 - 145 mEq/L   Potassium 3.8  3.5 - 5.1 mEq/L   Chloride 106  96 - 112 mEq/L   CO2 24  19 - 32 mEq/L   Glucose, Bld 106 (*) 70 - 99 mg/dL   BUN 10  6 - 23 mg/dL   Creatinine, Ser 1.19  0.50 - 1.10 mg/dL   Calcium 14.7  8.4 - 82.9 mg/dL   GFR calc non Af Amer >90  >90 mL/min   GFR calc Af Amer >90  >90 mL/min  TROPONIN I      Component Value Range   Troponin I <0.30  <0.30 ng/mL  D-DIMER, QUANTITATIVE      Component Value Range   D-Dimer, Quant 0.71 (*) 0.00 - 0.48 ug/mL-FEU     No results found.   No diagnosis found.    MDM   Date: 08/26/2012  Rate: 62  Rhythm: normal sinus rhythm with arrhythmia  QRS Axis: normal  Intervals: normal  ST/T Wave abnormalities: normal  Conduction Disutrbances: none  Narrative Interpretation: unremarkable    Will need admission for PE and DVT study      I personally performed the services described in this documentation, which was scribed in my presence. The recorded information has been reviewed and is accurate.   Was getting a colonscopy for a research study  Denies OCP/IUD/injectable or implantable contraception   Mao Lockner K Kateland Leisinger-Rasch, MD 08/26/12 5621

## 2012-08-26 NOTE — ED Notes (Signed)
Report called to [facility] Loura Halt, RN

## 2012-08-26 NOTE — ED Notes (Signed)
MD with pt  

## 2012-08-26 NOTE — Care Management (Addendum)
RN CM did [facility] @ 1130 with MD Galen Daft.  CM provided pt with 10 day free xarelto card and pt is to activate card before d/c from hospital and pt is aware. RN Shanda Bumps has Rx for 10 day free xarelto that she will give to pt at d/c. CM did call [facility] on [address] and medication is available for pick up. CM did relay this information to pt and the pharmacist was in the room at this time. No further needs from CM at this time. Gala Lewandowsky, RN,BSN [phone]

## 2012-08-26 NOTE — ED Notes (Signed)
Report given to [facility]. 

## 2012-08-26 NOTE — H&P (Signed)
Patient seen and examined by me.  Lovenox/coumadin vs xarelto.  Duplex ordered.  Will need ultrasound as an outpatient.  Follows in [place] for HIV treatment.  Only risk factor for PE is HIV.  Marlin Canary DO

## 2012-08-27 LAB — LUPUS ANTICOAGULANT PANEL: PTT Lupus Anticoagulant: 33.6 secs (ref 28.0–43.0)

## 2012-08-27 LAB — FACTOR 5 LEIDEN

## 2012-08-27 LAB — CARDIOLIPIN ANTIBODIES, IGG, IGM, IGA
Anticardiolipin IgG: 22 GPL U/mL (ref <23)
Anticardiolipin IgM: 63 MPL U/mL — ABNORMAL HIGH (ref <11)

## 2012-08-27 LAB — BETA-2-GLYCOPROTEIN I ABS, IGG/M/A: Beta-2-Glycoprotein I IgA: 5 A Units (ref <20)

## 2012-08-27 LAB — PROTEIN S ACTIVITY: Protein S Activity: 48 % — ABNORMAL LOW (ref 69–129)

## 2012-08-27 NOTE — Discharge Summary (Signed)
Patient seen and examined by me.  Xarelto arranged as an outpatient.  Follow up also arranged for patient  Marlin Canary DO

## 2012-08-28 LAB — PROTEIN S, TOTAL: Protein S Ag, Total: 68 % (ref 60–150)

## 2012-08-30 ENCOUNTER — Encounter (HOSPITAL_COMMUNITY): Payer: Self-pay

## 2012-08-30 ENCOUNTER — Emergency Department (HOSPITAL_COMMUNITY)
Admission: EM | Admit: 2012-08-30 | Discharge: 2012-08-30 | Disposition: A | Discharge status: Home / Self Care | Payer: Self-pay | Source: Home / Self Care

## 2012-08-30 DIAGNOSIS — K219 Gastro-esophageal reflux disease without esophagitis: Secondary | ICD-10-CM

## 2012-08-30 DIAGNOSIS — Z720 Tobacco use: Secondary | ICD-10-CM

## 2012-08-30 DIAGNOSIS — I2699 Other pulmonary embolism without acute cor pulmonale: Secondary | ICD-10-CM

## 2012-08-30 DIAGNOSIS — D649 Anemia, unspecified: Secondary | ICD-10-CM

## 2012-08-30 NOTE — ED Provider Notes (Addendum)
History     CSN: [id number]  Arrival date & time 08/30/12  1142   Chief Complaint  Patient presents with  . Follow-up   HPI Pt was recently discharged from the hospital with a urinary embolus and deep vein thrombosis.  She was discharged on Rivaroxaban and she is currently taking 15 mg twice daily which she will take for 3 weeks and then take 20 mg daily.  The patient does not have medical insurance and I asked her how she is obtaining the medication and she reports that she was given medication assistance in the hospital.  She was given a free 10 day supply with a discount card which she will use to continue taking the medication.  She reports that she's having no difficulty getting the medication at this time.  She reports that she is taking the medication.  She reports that she still has shortness of breath but has had some slight improvement.  She reports that she wants to go back to work.  She reports that she has been ambulating without significant difficulty.  She denies chest pain.  Past Medical History  Diagnosis Date  . Obesity   . Hypothyroidism   . HIV (human immunodeficiency virus infection)   . Hyperthyroidism   . Goiter   . GERD (gastroesophageal reflux disease)     Past Surgical History  Procedure Date  . Hernia repair   . Cesarean section     No family history on file.  History  Substance Use Topics  . Smoking status: Current Every Day Smoker -- 1.0 packs/day for 25 years    Types: Cigarettes  . Smokeless tobacco: Never Used  . Alcohol Use: Yes     Comment: rarely    OB History    Grav Para Term Preterm Abortions TAB SAB Ect Mult Living                 Review of Systems  Constitutional: Negative.   HENT: Negative.   Eyes: Negative.   Respiratory: Positive for shortness of breath.   Cardiovascular: Negative.   Gastrointestinal: Negative.   Musculoskeletal: Negative.   Neurological: Negative.   Psychiatric/Behavioral: Negative.     Allergies   Review of patient's allergies indicates no known allergies.  Home Medications   Current Outpatient Rx  Name  Route  Sig  Dispense  Refill  . ASPIRIN 81 MG PO CHEW   Oral   Chew 81 mg by mouth daily.         . STRIBILD PO   Oral   Take 1 tablet by mouth daily.          Marland Kitchen HYDROCODONE-ACETAMINOPHEN 5-325 MG PO TABS   Oral   Take 1-2 tablets by mouth every 4 (four) hours as needed.   15 tablet   0   . LEVOTHYROXINE SODIUM 175 MCG PO TABS   Oral   Take 175 mcg by mouth daily.         Marland Kitchen OMEPRAZOLE MAGNESIUM 20 MG PO TBEC   Oral   Take 20 mg by mouth daily.          Marland Kitchen RIVAROXABAN 15 MG PO TABS   Oral   Take 1 tablet (15 mg total) by mouth 2 (two) times daily with a meal.   20 tablet   0   . RIVAROXABAN 20 MG PO TABS   Oral   Take 1 tablet (20 mg total) by mouth daily with supper.   30 tablet  0     BP 121/76  Pulse 129  Temp 98 F (36.7 C) (Oral)  Resp 19  SpO2 99%  LMP 08/03/2012  Physical Exam  Nursing note and vitals reviewed. Constitutional: She is oriented to person, place, and time. She appears well-developed and well-nourished. No distress.  HENT:  Head: Normocephalic and atraumatic.  Eyes: Pupils are equal, round, and reactive to light.  Cardiovascular: Normal rate, regular rhythm and normal heart sounds.   Pulmonary/Chest: Effort normal and breath sounds normal.  Musculoskeletal: Normal range of motion.  Neurological: She is alert and oriented to person, place, and time.  Skin: Skin is warm and dry.  Psychiatric: She has a normal mood and affect. Her behavior is normal. Thought content normal.    ED Course  Procedures (including critical care time)  Labs Reviewed - No data to display No results found.   No diagnosis found.    MDM  IMPRESSION  Pulmonary embolus  DVT  Hypothyroidism  RECOMMENDATIONS / PLAN The patient was given additional counseling and support regarding DVT and pulmonary embolus today.  The patient was  advised to notify our office if she has any difficulty with obtaining her medication.  She reports that this time that she is having no difficulty getting the Rivaroxaban medication.  She does have instructions on how to take the medication appropriately.  I reviewed her hospital records and labs with her today.  I reviewed her x-rays.  The patient will return to work on Monday, December 23.   Of note: The patient reports that several months ago she had an ultrasound of her thyroid done and biopsy of her thyroid done in [place] with endocrinology group there.  She reports that she was told that she needed to continue monitoring her TSH regularly and taking her medication.  She is following up with [facility] for her infectious disease care.  FOLLOW UP 3 months for recheck and TSH  The patient was given clear instructions to go to ER or return to medical center if symptoms don't improve, worsen or new problems develop.  The patient verbalized understanding.  The patient was told to call to get lab results if they haven't heard anything in the next week.            Cleora Fleet, MD 08/30/12 1502  Cleora Fleet, MD 08/30/12 [phone]

## 2012-08-30 NOTE — ED Notes (Signed)
Follow up- recently discharged from [facility]. Dx- pulmonary embelism

## 2012-09-06 MED ORDER — RIVAROXABAN 20 MG PO TABS: 20.0000 mg | ORAL_TABLET | Freq: Every day | ORAL | Status: DC

## 2012-09-06 MED ORDER — RIVAROXABAN 15 MG PO TABS: 15.0000 mg | ORAL_TABLET | Freq: Two times a day (BID) | ORAL | Status: DC

## 2012-09-07 ENCOUNTER — Emergency Department (HOSPITAL_COMMUNITY): Payer: Self-pay

## 2012-09-07 ENCOUNTER — Observation Stay (HOSPITAL_COMMUNITY)
Admission: EM | Admit: 2012-09-07 | Discharge: 2012-09-08 | Disposition: A | Discharge status: Home / Self Care | Payer: Self-pay | Attending: Internal Medicine | Admitting: Internal Medicine

## 2012-09-07 ENCOUNTER — Encounter (HOSPITAL_COMMUNITY): Payer: Self-pay | Admitting: *Deleted

## 2012-09-07 DIAGNOSIS — R0602 Shortness of breath: Secondary | ICD-10-CM | POA: Insufficient documentation

## 2012-09-07 DIAGNOSIS — Z720 Tobacco use: Secondary | ICD-10-CM

## 2012-09-07 DIAGNOSIS — R0600 Dyspnea, unspecified: Secondary | ICD-10-CM

## 2012-09-07 DIAGNOSIS — R42 Dizziness and giddiness: Secondary | ICD-10-CM | POA: Insufficient documentation

## 2012-09-07 DIAGNOSIS — I2699 Other pulmonary embolism without acute cor pulmonale: Secondary | ICD-10-CM | POA: Diagnosis present

## 2012-09-07 DIAGNOSIS — I82409 Acute embolism and thrombosis of unspecified deep veins of unspecified lower extremity: Secondary | ICD-10-CM

## 2012-09-07 DIAGNOSIS — D509 Iron deficiency anemia, unspecified: Principal | ICD-10-CM | POA: Diagnosis present

## 2012-09-07 DIAGNOSIS — Z86711 Personal history of pulmonary embolism: Secondary | ICD-10-CM | POA: Insufficient documentation

## 2012-09-07 DIAGNOSIS — R0609 Other forms of dyspnea: Secondary | ICD-10-CM

## 2012-09-07 DIAGNOSIS — B20 Human immunodeficiency virus [HIV] disease: Secondary | ICD-10-CM

## 2012-09-07 DIAGNOSIS — Z86718 Personal history of other venous thrombosis and embolism: Secondary | ICD-10-CM | POA: Insufficient documentation

## 2012-09-07 DIAGNOSIS — N924 Excessive bleeding in the premenopausal period: Secondary | ICD-10-CM | POA: Insufficient documentation

## 2012-09-07 DIAGNOSIS — R51 Headache: Secondary | ICD-10-CM | POA: Insufficient documentation

## 2012-09-07 DIAGNOSIS — D500 Iron deficiency anemia secondary to blood loss (chronic): Secondary | ICD-10-CM

## 2012-09-07 DIAGNOSIS — K219 Gastro-esophageal reflux disease without esophagitis: Secondary | ICD-10-CM

## 2012-09-07 DIAGNOSIS — R079 Chest pain, unspecified: Secondary | ICD-10-CM | POA: Diagnosis present

## 2012-09-07 DIAGNOSIS — Z21 Asymptomatic human immunodeficiency virus [HIV] infection status: Secondary | ICD-10-CM | POA: Diagnosis present

## 2012-09-07 HISTORY — DX: Other pulmonary embolism without acute cor pulmonale: I26.99

## 2012-09-07 LAB — ABO/RH: ABO/RH(D): O POS

## 2012-09-07 LAB — CBC
HCT: 19.4 % — ABNORMAL LOW (ref 36.0–46.0)
Hemoglobin: 6.3 g/dL — CL (ref 12.0–15.0)
MCH: 25.2 pg — ABNORMAL LOW (ref 26.0–34.0)
MCHC: 32.5 g/dL (ref 30.0–36.0)
RDW: 16.5 % — ABNORMAL HIGH (ref 11.5–15.5)

## 2012-09-07 LAB — COMPREHENSIVE METABOLIC PANEL
Albumin: 3.4 g/dL — ABNORMAL LOW (ref 3.5–5.2)
BUN: 9 mg/dL (ref 6–23)
Calcium: 9.8 mg/dL (ref 8.4–10.5)
GFR calc Af Amer: >90 mL/min (ref >90)
Glucose, Bld: 100 mg/dL — ABNORMAL HIGH (ref 70–99)
Sodium: 137 mEq/L (ref 135–145)
Total Protein: 6.6 g/dL (ref 6.0–8.3)

## 2012-09-07 LAB — PREPARE RBC (CROSSMATCH)

## 2012-09-07 LAB — RETICULOCYTES
RBC.: 2.41 MIL/uL — ABNORMAL LOW (ref 3.87–5.11)
Retic Count, Absolute: 60.3 10*3/uL (ref 19.0–186.0)
Retic Ct Pct: 2.5 % (ref 0.4–3.1)

## 2012-09-07 LAB — POCT I-STAT TROPONIN I: Troponin i, poc: 0 ng/mL (ref 0.00–0.08)

## 2012-09-07 LAB — IRON AND TIBC

## 2012-09-07 LAB — VITAMIN B12: Vitamin B-12: 360 pg/mL (ref 211–911)

## 2012-09-07 MED ORDER — FERROUS SULFATE 325 (65 FE) MG PO TABS
325.0000 mg | ORAL_TABLET | Freq: Three times a day (TID) | ORAL | Status: DC
  Administered 2012-09-07 – 2012-09-08 (×5): 325 mg via ORAL
  Filled 2012-09-07 (×6): qty 1

## 2012-09-07 MED ORDER — MORPHINE SULFATE 4 MG/ML IJ SOLN
4.0000 mg | Freq: Once | INTRAMUSCULAR | Status: AC
  Administered 2012-09-07: 4 mg via INTRAVENOUS
  Filled 2012-09-07: qty 1

## 2012-09-07 MED ORDER — RIVAROXABAN 20 MG PO TABS: 20.0000 mg | ORAL_TABLET | Freq: Every day | ORAL | Status: DC

## 2012-09-07 MED ORDER — ONDANSETRON HCL 4 MG/2ML IJ SOLN
4.0000 mg | Freq: Four times a day (QID) | INTRAMUSCULAR | Status: DC | PRN
  Administered 2012-09-07: 4 mg via INTRAVENOUS
  Filled 2012-09-07: qty 2

## 2012-09-07 MED ORDER — LEVOTHYROXINE SODIUM 175 MCG PO TABS
175.0000 ug | ORAL_TABLET | Freq: Every day | ORAL | Status: DC
  Filled 2012-09-07 (×2): qty 1

## 2012-09-07 MED ORDER — SODIUM CHLORIDE 0.9 % IV BOLUS (SEPSIS)
1000.0000 mL | Freq: Once | INTRAVENOUS | Status: AC
  Administered 2012-09-07: 1000 mL via INTRAVENOUS

## 2012-09-07 MED ORDER — RIVAROXABAN 15 MG PO TABS
15.0000 mg | ORAL_TABLET | Freq: Two times a day (BID) | ORAL | Status: DC
  Administered 2012-09-07 – 2012-09-08 (×4): 15 mg via ORAL
  Filled 2012-09-07 (×6): qty 1

## 2012-09-07 MED ORDER — SODIUM CHLORIDE 0.9 % IV SOLN
INTRAVENOUS | Status: AC
  Administered 2012-09-07: 08:00:00 via INTRAVENOUS

## 2012-09-07 MED ORDER — HYDROMORPHONE HCL PF 1 MG/ML IJ SOLN
1.0000 mg | INTRAMUSCULAR | Status: AC | PRN
  Administered 2012-09-07 (×2): 1 mg via INTRAVENOUS
  Filled 2012-09-07 (×2): qty 1

## 2012-09-07 MED ORDER — ONDANSETRON HCL 4 MG/2ML IJ SOLN
4.0000 mg | Freq: Once | INTRAMUSCULAR | Status: AC
  Administered 2012-09-07: 4 mg via INTRAVENOUS
  Filled 2012-09-07: qty 2

## 2012-09-07 MED ORDER — HYDROCODONE-ACETAMINOPHEN 5-325 MG PO TABS
1.0000 | ORAL_TABLET | ORAL | Status: DC | PRN
  Administered 2012-09-07 – 2012-09-08 (×2): 2 via ORAL
  Filled 2012-09-07 (×2): qty 2

## 2012-09-07 MED ORDER — OMEPRAZOLE MAGNESIUM 20 MG PO TBEC: 20.0000 mg | DELAYED_RELEASE_TABLET | Freq: Every day | ORAL | Status: DC

## 2012-09-07 MED ORDER — POLYETHYLENE GLYCOL 3350 17 G PO PACK
17.0000 g | PACK | Freq: Every day | ORAL | Status: DC | PRN
  Filled 2012-09-07: qty 1

## 2012-09-07 MED ORDER — ONDANSETRON HCL 4 MG/2ML IJ SOLN: 4.0000 mg | Freq: Three times a day (TID) | INTRAMUSCULAR | Status: DC | PRN

## 2012-09-07 MED ORDER — HYDROMORPHONE HCL PF 1 MG/ML IJ SOLN
1.0000 mg | Freq: Once | INTRAMUSCULAR | Status: AC
  Administered 2012-09-07: 1 mg via INTRAVENOUS
  Filled 2012-09-07: qty 1

## 2012-09-07 MED ORDER — SODIUM CHLORIDE 0.9 % IV SOLN
INTRAVENOUS | Status: DC
  Administered 2012-09-07: 1000 mL via INTRAVENOUS

## 2012-09-07 MED ORDER — PANTOPRAZOLE SODIUM 40 MG PO TBEC
40.0000 mg | DELAYED_RELEASE_TABLET | Freq: Every day | ORAL | Status: DC
  Administered 2012-09-07 – 2012-09-08 (×2): 40 mg via ORAL
  Filled 2012-09-07: qty 1

## 2012-09-07 MED ORDER — ONDANSETRON HCL 4 MG PO TABS: 4.0000 mg | ORAL_TABLET | Freq: Four times a day (QID) | ORAL | Status: DC | PRN

## 2012-09-07 NOTE — ED Notes (Signed)
Pt in CT.

## 2012-09-07 NOTE — ED Provider Notes (Signed)
MSE was initiated and I personally evaluated the patient and placed orders (if any) at  6:35 AM on September 07, 2012.  The patient appears stable so that the remainder of the MSE may be completed by another provider.  Chrishon Martino Lytle Michaels, MD 09/07/12 [phone]

## 2012-09-07 NOTE — ED Notes (Signed)
Pt return from CT.

## 2012-09-07 NOTE — Progress Notes (Signed)
Pt admitted to unit from ED. Pt is alert and oriented to staff, call bell, and room. Bed in lowest position. Call bell within reach. Full assessment to Epic. Will continue to monitor. Fayne Norrie, RN

## 2012-09-07 NOTE — ED Notes (Signed)
X-ray in room with pt completing portable chest xray

## 2012-09-07 NOTE — ED Notes (Signed)
Pt stated that immediately following the morphine administration her jaw pain was resolved and her head pain was greatly reduced.  Pt taken to ct

## 2012-09-07 NOTE — ED Notes (Signed)
MD at bedside. 

## 2012-09-07 NOTE — ED Notes (Signed)
The pt is c/o a headache with chest pain and sob for 3 days hx of recent pe

## 2012-09-07 NOTE — ED Notes (Signed)
Old and new EKG given to Dr. Verl Bangs and a copy is placed on the chart

## 2012-09-07 NOTE — ED Provider Notes (Signed)
History     CSN: [id number]  Arrival date & time 09/07/12  0609   First MD Initiated Contact with Patient 09/07/12 [phone]      Chief Complaint  Patient presents with  . Chest Pain    (Consider location/radiation/quality/duration/timing/severity/associated sxs/prior treatment) The history is provided by the patient. No language interpreter was used.   36 year old female with past medical history of recently diagnosed DVT and pulmonary embolism, HIV, hypothyroidism, and menometrorrhagia presents today with chief complaint of headache, chest pain, and shortness of breath x3 days.  Patient states that she was seen and discharged from the hospital on Xarelto  Her symptoms of chest pain and shortness of breath got better after she left the hospital, however 3 days ago her chest pain and shortness of breath returned.  She has also had racing heart, excessive fatigue, dizziness when standing.  After starting Xarelto the patient began her menstrual cycle.  She normally has heavy bleeding for the first 2 days, however she states that she was bleeding through 2 overnight size pads every 30 minutes with her first 2 and half days of her cycle. She states that she was unable to leave her home due to the amount of blood loss.  The morning her period started she noticed that she was bleeding all the way down her legs through the hallway.  She states that this is the most bleeding she's ever experienced with her menstrual cycle. Patient has had increased shortness of breath.  The pain in her chest is intermittent.    Patient also reports left-sided headache.  Had a headache is frontal and temporal.  Behind the left eye, throbbing, she is associated facial pain in the trigeminal nerve distribution.  Denies photophobia, phonophobia,  N/V, visual changes, stiff neck, neck pain, rash, or "thunderclap" onset.    Denies fevers, chills, myalgias, arthralgias. Denies chest pressure, radiation to left arm, jaw or back,  or diaphoresis. Denies dysuria, flank pain, suprapubic pain, frequency, urgency, or hematuria. Denies abdominal pain, nausea, vomiting, diarrhea or constipation.    Past Medical History  Diagnosis Date  . Obesity   . Hypothyroidism   . HIV (human immunodeficiency virus infection)   . Hyperthyroidism   . Goiter   . GERD (gastroesophageal reflux disease)   . Pulmonary infarction     Past Surgical History  Procedure Date  . Hernia repair   . Cesarean section     No family history on file.  History  Substance Use Topics  . Smoking status: Current Every Day Smoker -- 1.0 packs/day for 25 years    Types: Cigarettes  . Smokeless tobacco: Never Used  . Alcohol Use: Yes     Comment: rarely    OB History    Grav Para Term Preterm Abortions TAB SAB Ect Mult Living                  Review of Systems Ten systems reviewed and are negative for acute change, except as noted in the HPI.   Allergies  Review of patient's allergies indicates no known allergies.  Home Medications   Current Outpatient Rx  Name  Route  Sig  Dispense  Refill  . STRIBILD PO   Oral   Take 1 tablet by mouth daily.          Marland Kitchen HYDROCODONE-ACETAMINOPHEN 5-325 MG PO TABS   Oral   Take 1-2 tablets by mouth every 4 (four) hours as needed.   15 tablet   0   .  LEVOTHYROXINE SODIUM 175 MCG PO TABS   Oral   Take 175 mcg by mouth daily.         Marland Kitchen OMEPRAZOLE MAGNESIUM 20 MG PO TBEC   Oral   Take 20 mg by mouth daily.          Marland Kitchen RIVAROXABAN 15 MG PO TABS   Oral   Take 1 tablet (15 mg total) by mouth 2 (two) times daily with a meal.   20 tablet   0   . RIVAROXABAN 20 MG PO TABS   Oral   Take 1 tablet (20 mg total) by mouth daily with supper.   30 tablet   0     BP 121/66  Pulse 81  Temp 98.6 F (37 C) (Oral)  Resp 20  SpO2 100%  LMP 09/02/2012  Physical Exam Physical Exam  Nursing note and vitals reviewed. Constitutional: She is oriented to person, place, and time. She appears  well-developed and well-nourished. No distress.  HENT:  Head: Normocephalic and atraumatic.  Eyes: Conjunctivae normal and EOM are normal. Pupils are equal, round, and reactive to light. No scleral icterus.  Neck: Normal range of motion.  Cardiovascular: Normal rate, regular rhythm and normal heart sounds.  Exam reveals no gallop and no friction rub.   No murmur heard. Pulmonary/Chest: Effort normal and breath sounds normal. No respiratory distress.  Abdominal: Soft. Bowel sounds are normal. She exhibits no distension and no mass. There is no tenderness. There is no guarding.  Neurological: She is alert and oriented to person, place, and time.  Speech is clear and goal oriented, follows commands Major Cranial nerves without deficit, no facial droop Normal strength in upper and lower extremities bilaterally including dorsiflexion and plantar flexion, strong and equal grip strength Sensation normal to light and sharp touch Moves extremities without ataxia, coordination intact Normal finger to nose and rapid alternating movements Skin: Skin is warm and dry. She is not diaphoretic.    ED Course  Procedures (including critical care time)  Labs Reviewed  CBC - Abnormal; Notable for the following:    WBC 3.9 (*)     RBC 2.50 (*)     Hemoglobin 6.3 (*)     HCT 19.4 (*)     MCV 77.6 (*)     MCH 25.2 (*)     RDW 16.5 (*)     All other components within normal limits  COMPREHENSIVE METABOLIC PANEL - Abnormal; Notable for the following:    Glucose, Bld 100 (*)     Albumin 3.4 (*)     Total Bilirubin 0.2 (*)     GFR calc non Af Amer 88 (*)     All other components within normal limits  POCT I-STAT TROPONIN I   Ct Head Wo Contrast  09/07/2012  *RADIOLOGY REPORT*  Clinical Data: Chest pain, headache.  CT HEAD WITHOUT CONTRAST  Technique:  Contiguous axial images were obtained from the base of the skull through the vertex without contrast.  Comparison: 03/13/2012  Findings: No acute  intracranial abnormality.  Specifically, no hemorrhage, hydrocephalus, mass lesion, acute infarction, or significant intracranial injury.  No acute calvarial abnormality. Visualized paranasal sinuses and mastoids clear.  Orbital soft tissues unremarkable.  IMPRESSION: No acute intracranial abnormality.   Original Report Authenticated By: Charlett Nose, M.D.    Dg Chest Port 1 View  09/07/2012  *RADIOLOGY REPORT*  Clinical Data: Chest pain.  PORTABLE CHEST - 1 VIEW  Comparison: Chest radiograph and CTA of the  chest performed 08/26/2012  Findings: The lungs are well-aerated and clear.  There is no evidence of focal opacification, pleural effusion or pneumothorax.  The cardiomediastinal silhouette is borderline enlarged.  No acute osseous abnormalities are seen.  IMPRESSION: No acute cardiopulmonary process seen; borderline cardiomegaly.   Original Report Authenticated By: Tonia Ghent, M.D.    09/07/2012  Date: 09/07/2012  Rate: 80  Rhythm: normal sinus rhythm  QRS Axis: normal  Intervals: normal  ST/T Wave abnormalities: normal  Conduction Disutrbances: none  Narrative Interpretation:   Old EKG Reviewed: No significant changes noted     1. Blood loss anemia   2. Pulmonary embolism   3. Chest pain   4. Headache   5. DVT (deep venous thrombosis)   6. HIV (human immunodeficiency virus infection)       MDM  7:45 AM Patient's lab results show a 3 g drop in her hemoglobin  from one week ago.  The patient has symptomatic racing heart and dizziness upon standing.  Have called for admission of the patient.  Will need blood transfusion.   8:08 AM I spoken with Dr. Robb Matar who has had green to admit the patient.   Arthor Captain, PA-C 09/07/12 0815  Arthor Captain, PA-C 09/11/12 1754

## 2012-09-07 NOTE — H&P (Signed)
[facility] History and Physical  Stephanie Miranda UJW:[id number] DOB: 01-25-1976 DOA: 09/07/2012  Referring physician: Dr. Fonnie Jarvis PCP: Pcp Not In System  Specialists: none  Chief Complaint: Dyspnea  HPI: Stephanie Miranda is a 36 y.o. female  With past medical history of PE and DVT diagnosed in November 2013 that comes in for shortness of breath and chest pain 3 days prior to admission. She relates a day prior to admission she started getting headaches which went away with Tylenol. A progressively gotten worse. To the point where the headaches are not relieved by Tylenol. She does started getting chest pain and shortness of breath especially on exertion to the point where she had to stop doing what she was doing in order to catch her breath. She came into the ED was found to have a hemoglobin of 6. She has menometrorrhagia. She normally has heavy bleeding for the first 2 days, however she states that she was bleeding through 2 overnight size pads every 30 minutes with her first 2 and half days of her cycle. She states that she was unable to leave her home due to the amount of blood loss.   Review of Systems: The patient denies anorexia, fever, weight loss,, vision loss, decreased hearing, hoarseness, chest pain, syncope, dyspnea on exertion, peripheral edema, balance deficits, hemoptysis, abdominal pain, melena, hematochezia, severe indigestion/heartburn, hematuria, incontinence, genital sores, muscle weakness, suspicious skin lesions, transient blindness, difficulty walking, depression, unusual weight change, abnormal bleeding, enlarged lymph nodes, angioedema, and breast masses.    Past Medical History  Diagnosis Date  . Obesity   . Hypothyroidism   . HIV (human immunodeficiency virus infection)   . Hyperthyroidism   . Goiter   . GERD (gastroesophageal reflux disease)   . Pulmonary infarction    Past Surgical History  Procedure Date  . Hernia repair   . Cesarean section     Social History:  reports that she has been smoking Cigarettes.  She has a 25 pack-year smoking history. She has never used smokeless tobacco. She reports that she drinks alcohol. She reports that she does not use illicit drugs. Visit on the family can perform all her ADLs No Known Allergies  Family History  Problem Relation Age of Onset  . Bell's palsy Mother   . Hypertension Mother   . Hypertension Father      Prior to Admission medications   Medication Sig Start Date End Date Taking? Authorizing Provider  Elviteg-Cobicis-Emtricit-Tenof (STRIBILD PO) Take 1 tablet by mouth daily.    Yes Historical Provider, MD  HYDROcodone-acetaminophen (NORCO/VICODIN) 5-325 MG per tablet Take 1-2 tablets by mouth every 4 (four) hours as needed. 08/26/12  Yes Lesle Chris Black, NP  levothyroxine (SYNTHROID, LEVOTHROID) 175 MCG tablet Take 175 mcg by mouth daily.   Yes Historical Provider, MD  omeprazole (PRILOSEC OTC) 20 MG tablet Take 20 mg by mouth daily.    Yes Historical Provider, MD  Rivaroxaban (XARELTO) 15 MG TABS tablet Take 1 tablet (15 mg total) by mouth 2 (two) times daily with a meal. 09/06/12  Yes Meredeth Ide, MD  Rivaroxaban (XARELTO) 20 MG TABS Take 1 tablet (20 mg total) by mouth daily with supper. 09/06/12   Meredeth Ide, MD   Physical Exam: Filed Vitals:   09/07/12 5621 09/07/12 3086 09/07/12 0627 09/07/12 0731  BP: 119/41 121/66  113/71  Pulse: 87 81  75  Temp: 98.2 F (36.8 C) 98.6 F (37 C)    TempSrc: Oral Oral  Resp: 18 20  18   SpO2: 100% 98% 100% 100%     General:  Awake alert and oriented x3 pale looking  Eyes: Anicteric pallor and conjunctiva  ENT: Moist because membrane  Neck: No JVD  Cardiovascular: Regular rate and rhythm with positive S1 and S2 no murmurs rubs gallops  Respiratory: Good air movement clear to auscultation  Abdomen: Positive bowel sounds nontender nondistended soft  Skin: No rashes or ulcerations  Musculoskeletal:  Intact  Psychiatric: Appropriate  Neurologic: Alert and oriented x4 coherent for language 3-12 are grossly intact sensation is intact almost strength is followed by for tremors deep tendon reflexes 2+ bilaterally finger to nose normal  Labs on Admission:  Basic Metabolic Panel:  Lab 09/07/12 3557  NA 137  K 4.1  CL 103  CO2 26  GLUCOSE 100*  BUN 9  CREATININE 0.83  CALCIUM 9.8  MG --  PHOS --   Liver Function Tests:  Lab 09/07/12 0631  AST 21  ALT 15  ALKPHOS 41  BILITOT 0.2*  PROT 6.6  ALBUMIN 3.4*   No results found for this basename: LIPASE:5,AMYLASE:5 in the last 168 hours No results found for this basename: AMMONIA:5 in the last 168 hours CBC:  Lab 09/07/12 0631  WBC 3.9*  NEUTROABS --  HGB 6.3*  HCT 19.4*  MCV 77.6*  PLT 155   Cardiac Enzymes: No results found for this basename: CKTOTAL:5,CKMB:5,CKMBINDEX:5,TROPONINI:5 in the last 168 hours  BNP (last 3 results) No results found for this basename: PROBNP:3 in the last 8760 hours CBG: No results found for this basename: GLUCAP:5 in the last 168 hours  Radiological Exams on Admission: Ct Head Wo Contrast  09/07/2012  *RADIOLOGY REPORT*  Clinical Data: Chest pain, headache.  CT HEAD WITHOUT CONTRAST  Technique:  Contiguous axial images were obtained from the base of the skull through the vertex without contrast.  Comparison: 03/13/2012  Findings: No acute intracranial abnormality.  Specifically, no hemorrhage, hydrocephalus, mass lesion, acute infarction, or significant intracranial injury.  No acute calvarial abnormality. Visualized paranasal sinuses and mastoids clear.  Orbital soft tissues unremarkable.  IMPRESSION: No acute intracranial abnormality.   Original Report Authenticated By: Charlett Nose, M.D.    Dg Chest Port 1 View  09/07/2012  *RADIOLOGY REPORT*  Clinical Data: Chest pain.  PORTABLE CHEST - 1 VIEW  Comparison: Chest radiograph and CTA of the chest performed 08/26/2012  Findings: The lungs  are well-aerated and clear.  There is no evidence of focal opacification, pleural effusion or pneumothorax.  The cardiomediastinal silhouette is borderline enlarged.  No acute osseous abnormalities are seen.  IMPRESSION: No acute cardiopulmonary process seen; borderline cardiomegaly.   Original Report Authenticated By: Tonia Ghent, M.D.     EKG: Sinus rhythm, normal axis no T wave abnormalities.  Assessment/Plan Principal Problem: Microcytic anemia/ dyspnea/chest pain: - This most likely to her heavy periods, turgor and check an anemia panel, coatin procedure 2 units of packed red blood cells, will give her a liter of normal saline. I'm sure her hemoglobin will drop after hydration. We'll have to check a CBC 2 hours post transfusion a stress treat as tolerated. She will continue on  Surround toXarelto. If  Her ferritin is low we'll go ahead and give her IV iron and continue iron pills as an outpatient.  Pulmonary embolism: - Continue Xarelto. We'll discuss with hematology if there are any options for her if this recurs. At this point we'll continue her on Zaroxolyn which is easy post transfusion.  HIV (human immunodeficiency virus infection): - Will continue HIV medications. Follow with her HIV doctor as an outpatient.  Code Status: full Family Communication: daughter Disposition Plan: HOME IN 47 HRS  Time spent: 75  Marinda Elk [facility] Pager [phone]  If 7PM-7AM, please contact night-coverage [email or web address] Password [email or web address] 09/07/2012, 8:40 AM

## 2012-09-08 DIAGNOSIS — I82409 Acute embolism and thrombosis of unspecified deep veins of unspecified lower extremity: Secondary | ICD-10-CM

## 2012-09-08 DIAGNOSIS — K219 Gastro-esophageal reflux disease without esophagitis: Secondary | ICD-10-CM

## 2012-09-08 LAB — TYPE AND SCREEN
ABO/RH(D): O POS
Antibody Screen: NEGATIVE
Unit division: 0

## 2012-09-08 LAB — CBC
HCT: 25.6 % — ABNORMAL LOW (ref 36.0–46.0)
Hemoglobin: 8.6 g/dL — ABNORMAL LOW (ref 12.0–15.0)
MCH: 27.1 pg (ref 26.0–34.0)
MCHC: 33.6 g/dL (ref 30.0–36.0)
MCV: 80.8 fL (ref 78.0–100.0)
RBC: 3.17 MIL/uL — ABNORMAL LOW (ref 3.87–5.11)

## 2012-09-08 LAB — COMPREHENSIVE METABOLIC PANEL
ALT: 14 U/L (ref 0–35)
AST: 22 U/L (ref 0–37)
Albumin: 3 g/dL — ABNORMAL LOW (ref 3.5–5.2)
CO2: 22 mEq/L (ref 19–32)
Calcium: 9.4 mg/dL (ref 8.4–10.5)
Sodium: 140 mEq/L (ref 135–145)
Total Protein: 5.9 g/dL — ABNORMAL LOW (ref 6.0–8.3)

## 2012-09-08 MED ORDER — FERUMOXYTOL INJECTION 510 MG/17 ML
1020.0000 mg | Freq: Once | INTRAVENOUS | Status: AC
  Administered 2012-09-08: 1020 mg via INTRAVENOUS
  Filled 2012-09-08: qty 34

## 2012-09-08 MED ORDER — FERROUS GLUCONATE 324 (38 FE) MG PO TABS: 324.0000 mg | ORAL_TABLET | Freq: Every day | ORAL | Status: DC

## 2012-09-08 MED ORDER — RIVAROXABAN 20 MG PO TABS: 20.0000 mg | ORAL_TABLET | Freq: Every day | ORAL | Status: DC

## 2012-09-08 MED ORDER — HYDROCODONE-ACETAMINOPHEN 5-325 MG PO TABS: 1.0000 | ORAL_TABLET | ORAL | Status: DC | PRN

## 2012-09-08 NOTE — Progress Notes (Signed)
Utilization review completed.  

## 2012-09-08 NOTE — Discharge Summary (Signed)
Physician Discharge Summary  Stephanie Miranda HYQ:[id number] DOB: [birth year] DOA: 09/07/2012  PCP: Pcp Not In System  Admit date: 09/07/2012 Discharge date: 09/08/2012  Time spent: 30 minutes  Recommendations for Outpatient Follow-up:      Follow-up Information    Please follow up. (GYN outpt follow- case manager to assist with setting up)       Follow up with Standley Dakins, MD. (in 1-2weeks, call for appt upon discharge)    Contact information:   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 She relates a day prior to admission she started getting headaches which went away with Tylenol. A progressively gotten worse. To the point where the headaches are not relieved by Tylenol. She does started getting chest pain and shortness of breath especially on exertion to the point where she had to stop doing what she was doing in order to catch her breath. She came into the ED was found to have a hemoglobin of 6. She has menometrorrhagia. She normally has heavy bleeding for the first 2 days, however she states that she was bleeding through 2 overnight size pads every 30 minutes with her first 2 and half days of her cycle. She states that she was unable to leave her home due to the amount of blood loss   Hospital Course:  Principal Problem:  *Microcytic anemia-severe iron deficiency -As discussed above, upon admission she was transfused 2 units of packed  red blood cells and her hemoglobin improved to 8.6. Anemia panel was done and came back with a ferritin of 3 and iron was less than 10. She was transfused IV iron and has been started on supplemental iron was ferrous gluconate upon discharge. It is noted that the xarelto she is on for her recent PE is likely contributing to the menorrhagia but she states she had a problem with heavy periods even prior to the xarelto. Her symptoms improved clinically following a transfusion, and she is no longer on her period at this time. She will need to followup in the GYN clinic-[facility], and the case manager has given her information to call for an appointment outpatient. She is also to followup with her PCP  (case manager has also given her information  for alternative her PCP in [place] Active Problems:  Pulmonary embolism -continue to xarelto as previously directed. Followup with PCP  HIV (human immunodeficiency virus infection) -Continue preadmission medications.   Procedures:  none  Consultations:  none  Discharge Exam: Filed Vitals:   09/07/12 2200 09/07/12 2234 09/08/12 0600 09/08/12 1347  BP: 81/55 108/65 103/56 103/68  Pulse: 69 66 65 70  Temp: 97.9 F (36.6 C)  97.9 F (36.6 C) 98.7 F (37.1 C)  TempSrc: Oral  Oral   Resp: 18 17 16 16   Weight: 93.4 kg (205 lb 14.6 oz)     SpO2: 98%  98% 95%    General: Young female alert and  oriented in no apparent distress Cardiovascular: Regular rate and rhythm, normal S1-S2 Respiratory: Clear to auscultation bilaterally no crackles or wheezes Extremities: No cyanosis and no edema  Discharge Instructions  Discharge Orders    Future Orders Please Complete By Expires   Diet general      Increase activity slowly          Medication List     As of 09/08/2012  3:05 PM    TAKE these medications         ferrous gluconate 324 MG tablet   Commonly known as: FERGON   Take 1 tablet (324 mg total) by mouth daily with breakfast.       HYDROcodone-acetaminophen 5-325 MG per tablet   Commonly known as: NORCO/VICODIN   Take 1-2 tablets by mouth every 4 (four) hours as needed. For pain      levothyroxine 175 MCG tablet   Commonly known as: SYNTHROID, LEVOTHROID   Take 175 mcg by mouth daily.      omeprazole 20 MG tablet   Commonly known as: PRILOSEC OTC   Take 20 mg by mouth daily.      Rivaroxaban 15 MG Tabs tablet   Commonly known as: XARELTO   Take 1 tablet (15 mg total) by mouth 2 (two) times daily with a meal.      Rivaroxaban 20 MG Tabs   Commonly known as: XARELTO   Take 1 tablet (20 mg total) by mouth daily with supper.      STRIBILD PO   Take 1 tablet by mouth daily.           Follow-up Information    Please follow up. (GYN outpt follow- case manager to assist with setting up)       Follow up with Standley Dakins, MD. (in 1-2weeks, call for appt upon discharge)    Contact information:   [address] [place] [address] [phone]           The results of significant diagnostics from this hospitalization (including imaging, microbiology, ancillary and laboratory) are listed below for reference.    Significant Diagnostic Studies: Dg Chest 2 View  08/26/2012  *RADIOLOGY REPORT*  Clinical Data: Chest pain  CHEST - 2 VIEW  Comparison: 05/05/2012  Findings: Increased opacity of the left greater than right lung bases.  No definite pleural effusion or pneumothorax.  Mildly tortuous aorta without aneurysmal dilatation. Upper normal heart size.  No acute osseous finding.  IMPRESSION: Mild left greater than right lung base opacities; atelectasis versus infiltrate.   Original Report Authenticated By: Jearld Lesch, M.D.    Ct Head Wo Contrast  09/07/2012  *RADIOLOGY REPORT*  Clinical Data: Chest pain, headache.  CT HEAD WITHOUT CONTRAST  Technique:  Contiguous axial images were obtained from the base of the skull through the vertex without contrast.  Comparison: 03/13/2012  Findings: No  acute intracranial abnormality.  Specifically, no hemorrhage, hydrocephalus, mass lesion, acute infarction, or significant intracranial injury.  No acute calvarial abnormality. Visualized paranasal sinuses and mastoids clear.  Orbital soft tissues unremarkable.  IMPRESSION: No acute intracranial abnormality.   Original Report Authenticated By: Charlett Nose, M.D.    Ct Angio Chest W/cm &/or Wo Cm  08/26/2012  *RADIOLOGY REPORT*  Clinical Data: Chest pain, elevated D-dimer.  CT ANGIOGRAPHY CHEST  Technique:  Multidetector CT imaging of the chest using the standard protocol during bolus administration of intravenous contrast. Multiplanar reconstructed images including MIPs were obtained and reviewed to evaluate  the vascular anatomy.  Contrast: 80mL OMNIPAQUE IOHEXOL 350 MG/ML SOLN  Comparison: 11/25/2010  Findings: Right lower lobe pulmonary embolism within the segmental branch.  Normal caliber aorta and branch vessels.  Heart size upper normal.  No CT evidence for right ventricular heart strain.  No pleural or pericardial effusion.  Limited images through the upper abdomen show multiple water attenuation hepatic hypodensities, most in keeping with biliary cysts.  No intrathoracic lymphadenopathy.  Central airways are patent. Mild bibasilar opacities, most in keeping with atelectasis.  No pneumothorax.  No acute osseous finding.  Enlarged/heterogeneous attenuation of the right lobe of the thyroid gland.  IMPRESSION: Right lower lobe segmental branch pulmonary embolism.  Subsegmental bibasilar atelectasis.  Enlarged/heterogeneous attenuation of the right lobe of the thyroid gland can be further evaluated with a non emergent ultrasound.  Critical Value/emergent results were called by telephone at the time of interpretation on 08/26/2012 at 03:50 a.m. to Dr. Nicanor Alcon, who verbally acknowledged these results.   Original Report Authenticated By: Jearld Lesch, M.D.    Dg Chest Port 1 View  09/07/2012  *RADIOLOGY  REPORT*  Clinical Data: Chest pain.  PORTABLE CHEST - 1 VIEW  Comparison: Chest radiograph and CTA of the chest performed 08/26/2012  Findings: The lungs are well-aerated and clear.  There is no evidence of focal opacification, pleural effusion or pneumothorax.  The cardiomediastinal silhouette is borderline enlarged.  No acute osseous abnormalities are seen.  IMPRESSION: No acute cardiopulmonary process seen; borderline cardiomegaly.   Original Report Authenticated By: Tonia Ghent, M.D.     Microbiology: No results found for this or any previous visit (from the past 240 hour(s)).   Labs: Basic Metabolic Panel:  Lab 09/08/12 1191 09/07/12 0631  NA 140 137  K 3.8 4.1  CL 108 103  CO2 22 26  GLUCOSE 117* 100*  BUN 4* 9  CREATININE 0.71 0.83  CALCIUM 9.4 9.8  MG -- --  PHOS -- --   Liver Function Tests:  Lab 09/08/12 0840 09/07/12 0631  AST 22 21  ALT 14 15  ALKPHOS 40 41  BILITOT 0.3 0.2*  PROT 5.9* 6.6  ALBUMIN 3.0* 3.4*   No results found for this basename: LIPASE:5,AMYLASE:5 in the last 168 hours No results found for this basename: AMMONIA:5 in the last 168 hours CBC:  Lab 09/08/12 0840 09/07/12 0631  WBC 4.3 3.9*  NEUTROABS -- --  HGB 8.6* 6.3*  HCT 25.6* 19.4*  MCV 80.8 77.6*  PLT 132* 155   Cardiac Enzymes: No results found for this basename: CKTOTAL:5,CKMB:5,CKMBINDEX:5,TROPONINI:5 in the last 168 hours BNP: BNP (last 3 results) No results found for this basename: PROBNP:3 in the last 8760 hours CBG: No results found for this basename: GLUCAP:5 in the last 168 hours     Signed:  Kela Millin  [facility] 09/08/2012, 3:05 PM

## 2012-09-08 NOTE — Progress Notes (Signed)
Patient discharged to home. Patient AVS reviewed, copy of AVS and physical prescription given to patient. Patient verbalized understanding of medications and follow-up appointments.  Patient remains stable; no signs or symptoms of distress.  Educated to return to the ER in cases of exacerbation of admitting diagnosis, SOB, dizziness, fever, chest pain, or fainting.

## 2012-09-08 NOTE — Progress Notes (Signed)
NCM provided information on discharge instruction of Primary Care Physicians to follow up with in community post discharge. Patient states she has been to [facility] in [place] and they offer fee based services. She will try to set up appt with [facility].  Isidoro Donning RN CCM Case Mgmt phone [phone]

## 2012-09-16 ENCOUNTER — Telehealth (HOSPITAL_COMMUNITY): Payer: Self-pay

## 2012-09-17 ENCOUNTER — Encounter (HOSPITAL_COMMUNITY): Payer: Self-pay | Admitting: Emergency Medicine

## 2012-09-17 ENCOUNTER — Emergency Department (HOSPITAL_COMMUNITY): Payer: Self-pay

## 2012-09-17 ENCOUNTER — Emergency Department (HOSPITAL_COMMUNITY)
Admission: EM | Admit: 2012-09-17 | Discharge: 2012-09-17 | Disposition: A | Discharge status: Home / Self Care | Payer: Self-pay | Attending: Emergency Medicine | Admitting: Emergency Medicine

## 2012-09-17 ENCOUNTER — Other Ambulatory Visit: Payer: Self-pay

## 2012-09-17 DIAGNOSIS — E669 Obesity, unspecified: Secondary | ICD-10-CM | POA: Insufficient documentation

## 2012-09-17 DIAGNOSIS — Z8639 Personal history of other endocrine, nutritional and metabolic disease: Secondary | ICD-10-CM | POA: Insufficient documentation

## 2012-09-17 DIAGNOSIS — K5900 Constipation, unspecified: Secondary | ICD-10-CM | POA: Insufficient documentation

## 2012-09-17 DIAGNOSIS — R0989 Other specified symptoms and signs involving the circulatory and respiratory systems: Secondary | ICD-10-CM | POA: Insufficient documentation

## 2012-09-17 DIAGNOSIS — F17200 Nicotine dependence, unspecified, uncomplicated: Secondary | ICD-10-CM | POA: Insufficient documentation

## 2012-09-17 DIAGNOSIS — Z21 Asymptomatic human immunodeficiency virus [HIV] infection status: Secondary | ICD-10-CM | POA: Insufficient documentation

## 2012-09-17 DIAGNOSIS — Z8679 Personal history of other diseases of the circulatory system: Secondary | ICD-10-CM | POA: Insufficient documentation

## 2012-09-17 DIAGNOSIS — Z86718 Personal history of other venous thrombosis and embolism: Secondary | ICD-10-CM | POA: Insufficient documentation

## 2012-09-17 DIAGNOSIS — R109 Unspecified abdominal pain: Secondary | ICD-10-CM | POA: Insufficient documentation

## 2012-09-17 DIAGNOSIS — R079 Chest pain, unspecified: Secondary | ICD-10-CM | POA: Insufficient documentation

## 2012-09-17 DIAGNOSIS — E039 Hypothyroidism, unspecified: Secondary | ICD-10-CM | POA: Insufficient documentation

## 2012-09-17 DIAGNOSIS — R110 Nausea: Secondary | ICD-10-CM | POA: Insufficient documentation

## 2012-09-17 DIAGNOSIS — Z86711 Personal history of pulmonary embolism: Secondary | ICD-10-CM | POA: Insufficient documentation

## 2012-09-17 DIAGNOSIS — Z79899 Other long term (current) drug therapy: Secondary | ICD-10-CM | POA: Insufficient documentation

## 2012-09-17 DIAGNOSIS — I2699 Other pulmonary embolism without acute cor pulmonale: Secondary | ICD-10-CM | POA: Insufficient documentation

## 2012-09-17 DIAGNOSIS — K219 Gastro-esophageal reflux disease without esophagitis: Secondary | ICD-10-CM | POA: Insufficient documentation

## 2012-09-17 DIAGNOSIS — Z862 Personal history of diseases of the blood and blood-forming organs and certain disorders involving the immune mechanism: Secondary | ICD-10-CM | POA: Insufficient documentation

## 2012-09-17 DIAGNOSIS — R0600 Dyspnea, unspecified: Secondary | ICD-10-CM

## 2012-09-17 DIAGNOSIS — R0609 Other forms of dyspnea: Secondary | ICD-10-CM | POA: Insufficient documentation

## 2012-09-17 HISTORY — DX: Acute embolism and thrombosis of unspecified deep veins of unspecified lower extremity: I82.409

## 2012-09-17 HISTORY — DX: Other pulmonary embolism without acute cor pulmonale: I26.99

## 2012-09-17 LAB — CBC WITH DIFFERENTIAL/PLATELET
Basophils Relative: 1 % (ref 0–1)
Eosinophils Absolute: 0.2 10*3/uL (ref 0.0–0.7)
HCT: 35.2 % — ABNORMAL LOW (ref 36.0–46.0)
Hemoglobin: 11.3 g/dL — ABNORMAL LOW (ref 12.0–15.0)
Lymphs Abs: 0.7 10*3/uL (ref 0.7–4.0)
MCH: 27.2 pg (ref 26.0–34.0)
MCHC: 32.1 g/dL (ref 30.0–36.0)
MCV: 84.6 fL (ref 78.0–100.0)
Monocytes Absolute: 0.3 10*3/uL (ref 0.1–1.0)
Monocytes Relative: 7 % (ref 3–12)
RBC: 4.16 MIL/uL (ref 3.87–5.11)

## 2012-09-17 LAB — URINALYSIS, ROUTINE W REFLEX MICROSCOPIC
Bilirubin Urine: NEGATIVE
Nitrite: NEGATIVE
Protein, ur: NEGATIVE mg/dL
Specific Gravity, Urine: 1.018 (ref 1.005–1.030)
Urobilinogen, UA: 0.2 mg/dL (ref 0.0–1.0)

## 2012-09-17 LAB — URINE MICROSCOPIC-ADD ON

## 2012-09-17 LAB — COMPREHENSIVE METABOLIC PANEL
ALT: 18 U/L (ref 0–35)
Alkaline Phosphatase: 58 U/L (ref 39–117)
BUN: 6 mg/dL (ref 6–23)
CO2: 26 mEq/L (ref 19–32)
Calcium: 10.1 mg/dL (ref 8.4–10.5)
GFR calc Af Amer: >90 mL/min (ref >90)
GFR calc non Af Amer: >90 mL/min (ref >90)
Glucose, Bld: 91 mg/dL (ref 70–99)
Sodium: 137 mEq/L (ref 135–145)
Total Protein: 7.8 g/dL (ref 6.0–8.3)

## 2012-09-17 LAB — LIPASE, BLOOD: Lipase: 20 U/L (ref 11–59)

## 2012-09-17 LAB — POCT I-STAT TROPONIN I

## 2012-09-17 MED ORDER — GI COCKTAIL ~~LOC~~
30.0000 mL | Freq: Once | ORAL | Status: DC
  Filled 2012-09-17: qty 30

## 2012-09-17 MED ORDER — SODIUM CHLORIDE 0.9 % IV BOLUS (SEPSIS)
1000.0000 mL | Freq: Once | INTRAVENOUS | Status: AC
  Administered 2012-09-17: 1000 mL via INTRAVENOUS

## 2012-09-17 MED ORDER — PANTOPRAZOLE SODIUM 40 MG IV SOLR
40.0000 mg | Freq: Once | INTRAVENOUS | Status: AC
  Administered 2012-09-17: 40 mg via INTRAVENOUS
  Filled 2012-09-17: qty 40

## 2012-09-17 MED ORDER — IOHEXOL 350 MG/ML SOLN
100.0000 mL | Freq: Once | INTRAVENOUS | Status: AC | PRN
  Administered 2012-09-17: 80 mL via INTRAVENOUS

## 2012-09-17 NOTE — ED Notes (Signed)
Discharge instructions reviewed. Pt verbalized understanding.  

## 2012-09-17 NOTE — ED Notes (Signed)
Pt states she has been having cp x 2days that starts at central chest and radiates towards back on both sides. Pt states she was recently treated here on Dec 19th for PE. Pt also c/o sob, nausea.

## 2012-09-17 NOTE — ED Notes (Signed)
Pt back from X-ray.  

## 2012-09-17 NOTE — ED Notes (Signed)
Pt still in US

## 2012-09-17 NOTE — ED Notes (Signed)
Pt sts no BM in 6 days not per norm for pt

## 2012-09-17 NOTE — ED Notes (Signed)
Patient transported to Ultrasound 

## 2012-09-17 NOTE — ED Notes (Signed)
Patient transported to X-ray 

## 2012-09-17 NOTE — ED Notes (Signed)
Dr Ray at bedside. 

## 2012-09-17 NOTE — ED Notes (Signed)
Patient transported to CT 

## 2012-09-17 NOTE — ED Notes (Signed)
Pt c/o mid sternal CP with radiation to both rib area worse on right; pt sts worse when laying down and moving; pt sts some SOB; pt sts hx of DVT and PE

## 2012-09-17 NOTE — ED Provider Notes (Signed)
History     CSN: [id number]  Arrival date & time 09/17/12  1116   First MD Initiated Contact with Patient 09/17/12 1148      Chief Complaint  Patient presents with  . Chest Pain    (Consider location/radiation/quality/duration/timing/severity/associated sxs/prior treatment) HPI   Patient with crampy epigastric pain radiates to right with some sharp pain to the left.   began 2-3 days ago with similar symptoms with "gas" before but not lasted this long. Patient had pe in December and states this is different.  Patient states she continues sob as she has been since pe.   Pain constant for three days, but improved with vicodin.  Patient tried es tylenol.  Patient has had nausea but no vomiting, taking po without difficulty.  No fever, cough for a month.  Patient states no bowel movement for three days.  States she is taking iron.    Past Medical History  Diagnosis Date  . Obesity   . Hypothyroidism   . HIV (human immunodeficiency virus infection)   . Hyperthyroidism   . Goiter   . GERD (gastroesophageal reflux disease)   . Pulmonary infarction   . DVT (deep venous thrombosis)   . PE (pulmonary embolism)     Past Surgical History  Procedure Date  . Hernia repair   . Cesarean section     Family History  Problem Relation Age of Onset  . Bell's palsy Mother   . Hypertension Mother   . Hypertension Father     History  Substance Use Topics  . Smoking status: Current Every Day Smoker -- 1.0 packs/day for 25 years    Types: Cigarettes  . Smokeless tobacco: Never Used  . Alcohol Use: Yes     Comment: rarely    OB History    Grav Para Term Preterm Abortions TAB SAB Ect Mult Living                  Review of Systems  Allergies  Review of patient's allergies indicates no known allergies.  Home Medications   Current Outpatient Rx  Name  Route  Sig  Dispense  Refill  . ELVITEG-COBICIS-EMTRICIT-TENOF 150-150-200-300 MG PO TABS   Oral   Take 1 tablet by mouth daily  with breakfast.         . FERROUS GLUCONATE 324 (38 FE) MG PO TABS   Oral   Take 324 mg by mouth daily with breakfast.         . HYDROCODONE-ACETAMINOPHEN 5-325 MG PO TABS   Oral   Take 1-2 tablets by mouth every 4 (four) hours as needed. For pain         . LEVOTHYROXINE SODIUM 175 MCG PO TABS   Oral   Take 175 mcg by mouth daily.         Marland Kitchen OMEPRAZOLE MAGNESIUM 20 MG PO TBEC   Oral   Take 20 mg by mouth daily.          Marland Kitchen RIVAROXABAN 20 MG PO TABS   Oral   Take 1 tablet (20 mg total) by mouth daily with supper.   30 tablet   0     Start after completing 21days of th 15mg  dose 2x/d .Marland KitchenMarland Kitchen     BP 118/81  Pulse 59  Temp 97.9 F (36.6 C) (Oral)  Resp 18  SpO2 100%  LMP 09/02/2012  Physical Exam  Nursing note and vitals reviewed. Constitutional: She appears well-developed and well-nourished.  HENT:  Head:  Normocephalic and atraumatic.  Eyes: Conjunctivae normal and EOM are normal. Pupils are equal, round, and reactive to light.  Neck: Normal range of motion. Neck supple.  Cardiovascular: Normal rate, regular rhythm, normal heart sounds and intact distal pulses.   Pulmonary/Chest: Effort normal and breath sounds normal.  Abdominal: Soft. Bowel sounds are normal. There is tenderness.       Tender epigastrium and ruq  Musculoskeletal: Normal range of motion.  Neurological: She is alert.  Skin: Skin is warm and dry.  Psychiatric: She has a normal mood and affect. Thought content normal.    ED Course  Procedures (including critical care time)   Labs Reviewed  CBC WITH DIFFERENTIAL  BASIC METABOLIC PANEL  D-DIMER, QUANTITATIVE   No results found.   No diagnosis found.   Results for orders placed during the hospital encounter of 09/17/12  CBC WITH DIFFERENTIAL      Component Value Range   WBC 4.3  4.0 - 10.5 K/uL   RBC 4.16  3.87 - 5.11 MIL/uL   Hemoglobin 11.3 (*) 12.0 - 15.0 g/dL   HCT 96.0 (*) 45.4 - 09.8 %   MCV 84.6  78.0 - 100.0 fL   MCH  27.2  26.0 - 34.0 pg   MCHC 32.1  30.0 - 36.0 g/dL   RDW 11.9 (*) 14.7 - 82.9 %   Platelets 116 (*) 150 - 400 K/uL   Neutrophils Relative 71  43 - 77 %   Neutro Abs 3.1  1.7 - 7.7 K/uL   Lymphocytes Relative 17  12 - 46 %   Lymphs Abs 0.7  0.7 - 4.0 K/uL   Monocytes Relative 7  3 - 12 %   Monocytes Absolute 0.3  0.1 - 1.0 K/uL   Eosinophils Relative 4  0 - 5 %   Eosinophils Absolute 0.2  0.0 - 0.7 K/uL   Basophils Relative 1  0 - 1 %   Basophils Absolute 0.0  0.0 - 0.1 K/uL  D-DIMER, QUANTITATIVE      Component Value Range   D-Dimer, Quant 0.34  0.00 - 0.48 ug/mL-FEU  COMPREHENSIVE METABOLIC PANEL      Component Value Range   Sodium 137  135 - 145 mEq/L   Potassium 3.7  3.5 - 5.1 mEq/L   Chloride 102  96 - 112 mEq/L   CO2 26  19 - 32 mEq/L   Glucose, Bld 91  70 - 99 mg/dL   BUN 6  6 - 23 mg/dL   Creatinine, Ser 5.62  0.50 - 1.10 mg/dL   Calcium 13.0  8.4 - 86.5 mg/dL   Total Protein 7.8  6.0 - 8.3 g/dL   Albumin 3.8  3.5 - 5.2 g/dL   AST 19  0 - 37 U/L   ALT 18  0 - 35 U/L   Alkaline Phosphatase 58  39 - 117 U/L   Total Bilirubin 0.3  0.3 - 1.2 mg/dL   GFR calc non Af Amer >90  >90 mL/min   GFR calc Af Amer >90  >90 mL/min  LIPASE, BLOOD      Component Value Range   Lipase 20  11 - 59 U/L  URINALYSIS, ROUTINE W REFLEX MICROSCOPIC      Component Value Range   Color, Urine YELLOW  YELLOW   APPearance CLEAR  CLEAR   Specific Gravity, Urine 1.018  1.005 - 1.030   pH 7.0  5.0 - 8.0   Glucose, UA NEGATIVE  NEGATIVE mg/dL  Hgb urine dipstick LARGE (*) NEGATIVE   Bilirubin Urine NEGATIVE  NEGATIVE   Ketones, ur NEGATIVE  NEGATIVE mg/dL   Protein, ur NEGATIVE  NEGATIVE mg/dL   Urobilinogen, UA 0.2  0.0 - 1.0 mg/dL   Nitrite NEGATIVE  NEGATIVE   Leukocytes, UA NEGATIVE  NEGATIVE  PREGNANCY, URINE      Component Value Range   Preg Test, Ur NEGATIVE  NEGATIVE  PROTIME-INR      Component Value Range   Prothrombin Time 16.4 (*) 11.6 - 15.2 seconds   INR 1.35  0.00 - 1.49    POCT I-STAT TROPONIN I      Component Value Range   Troponin i, poc 0.00  0.00 - 0.08 ng/mL   Comment 3           URINE MICROSCOPIC-ADD ON      Component Value Range   Squamous Epithelial / LPF RARE  RARE   WBC, UA 0-2  <3 WBC/hpf   RBC / HPF TOO NUMEROUS TO COUNT  <3 RBC/hpf   Bacteria, UA RARE  RARE  Dg Chest 2 View  09/17/2012  *RADIOLOGY REPORT*  Clinical Data: Chest pain and shortness of breath  CHEST - 2 VIEW  Comparison: September 07, 2012  Findings: There is no edema or consolidation.  The heart is upper normal in size with pulmonary vascularity within normal limits.  No adenopathy.  No bone lesions. There is lower thoracic levoscoliosis.  IMPRESSION: No edema or consolidation.   Original Report Authenticated By: Bretta Bang, M.D.    Dg Chest 2 View  08/26/2012  *RADIOLOGY REPORT*  Clinical Data: Chest pain  CHEST - 2 VIEW  Comparison: 05/05/2012  Findings: Increased opacity of the left greater than right lung bases.  No definite pleural effusion or pneumothorax.  Mildly tortuous aorta without aneurysmal dilatation. Upper normal heart size.  No acute osseous finding.  IMPRESSION: Mild left greater than right lung base opacities; atelectasis versus infiltrate.   Original Report Authenticated By: Jearld Lesch, M.D.    Ct Head Wo Contrast  09/07/2012  *RADIOLOGY REPORT*  Clinical Data: Chest pain, headache.  CT HEAD WITHOUT CONTRAST  Technique:  Contiguous axial images were obtained from the base of the skull through the vertex without contrast.  Comparison: 03/13/2012  Findings: No acute intracranial abnormality.  Specifically, no hemorrhage, hydrocephalus, mass lesion, acute infarction, or significant intracranial injury.  No acute calvarial abnormality. Visualized paranasal sinuses and mastoids clear.  Orbital soft tissues unremarkable.  IMPRESSION: No acute intracranial abnormality.   Original Report Authenticated By: Charlett Nose, M.D.    Ct Angio Chest Pe W/cm &/or Wo  Cm  09/17/2012  *RADIOLOGY REPORT*  Clinical Data: Mid sternal chest pain with history of radiation therapy to thorax, history of pulmonary embolism and DVT  CT ANGIOGRAPHY CHEST  Technique:  Multidetector CT imaging of the chest using the standard protocol during bolus administration of intravenous contrast. Multiplanar reconstructed images including MIPs were obtained and reviewed to evaluate the vascular anatomy.  Contrast: OMNIPAQUE IOHEXOL 350 MG/ML SOLN  Comparison: CT thorax 08/26/2012  Findings: There is bilateral dependent atelectasis, with a band of discoid atelectasis in both lung bases.  There is no evidence of consolidation or infiltrate.  There are no pleural effusions.  The thoracic aorta shows no dissection or dilatation.There are no filling defects within the pulmonary arterial system; the embolus seen on the prior study is no longer present.  The osseous thorax shows no acute findings.  There  are again numerous hepatic cysts.  IMPRESSION: No acute findings.  No evidence of pulmonary arterial embolism.   Original Report Authenticated By: Esperanza Heir, M.D.    Ct Angio Chest W/cm &/or Wo Cm US Abdomen Complete  09/17/2012  *RADIOLOGY REPORT*  Clinical Data:  Double pain  COMPLETE ABDOMINAL ULTRASOUND  Comparison:  None.  Findings:  Gallbladder:  No gallstones, gallbladder wall thickening, or pericholecystic fluid.  Common bile duct:  6 mm  Liver:  The liver is diffusely heterogeneous suggesting fatty infiltration.  There are numerous simple cysts the largest measuring 2.5 cm in diameter.  There are  IVC:  Appears normal.  Pancreas:  No focal abnormality seen.  Spleen:  Mild splenomegaly with span of 13.5 cm in volume of 504 ml.  There is a 14 mm accessory spleen in the splenic hilum.  Right Kidney:  12.9 cm  Left Kidney:  12.5 cm  Abdominal aorta:  No aneurysm identified.  IMPRESSION: No acute abnormality, but there is mild splenomegaly.  There is also mildly heterogeneous echotexture of  the liver suggesting fatty infiltration.   Original Report Authenticated By: Esperanza Heir, M.D.    Dg Chest Port 1 View  09/07/2012  *RADIOLOGY REPORT*  Clinical Data: Chest pain.  PORTABLE CHEST - 1 VIEW  Comparison: Chest radiograph and CTA of the chest performed 08/26/2012  Findings: The lungs are well-aerated and clear.  There is no evidence of focal opacification, pleural effusion or pneumothorax.  The cardiomediastinal silhouette is borderline enlarged.  No acute osseous abnormalities are seen.  IMPRESSION: No acute cardiopulmonary process seen; borderline cardiomegaly.   Original Report Authenticated By: Tonia Ghent, M.D.     Date: 09/17/2012  Rate: 71  Rhythm: normal sinus rhythm  QRS Axis: normal  Intervals: normal  ST/T Wave abnormalities: normal  Conduction Disutrbances:none  Narrative Interpretation:   Old EKG Reviewed: none available   MDM  Patient with history of pe in December on xarelto complaining of upper abdomnal pain crampy in nature and ongoing dyspnea.  Patient also thinks she is constipated but has been scared to take anything because she is on blood thinners.  PE has resolved on ct, and no evidence of gb disease on [place].  Patient advised on constipation and will follow up with her pmd.         Hilario Quarry, MD 09/17/12 [phone]

## 2012-09-25 NOTE — ED Provider Notes (Signed)
Medical screening examination/treatment/procedure(s) were performed by non-physician practitioner and as supervising physician I was immediately available for consultation/collaboration.  Trenee Igoe Lytle Michaels, MD 09/25/12 [phone]

## 2012-10-17 ENCOUNTER — Encounter (HOSPITAL_COMMUNITY): Payer: Self-pay | Admitting: Emergency Medicine

## 2012-10-17 ENCOUNTER — Emergency Department (HOSPITAL_COMMUNITY): Payer: Self-pay

## 2012-10-17 ENCOUNTER — Emergency Department (HOSPITAL_COMMUNITY)
Admission: EM | Admit: 2012-10-17 | Discharge: 2012-10-18 | Disposition: A | Discharge status: Home / Self Care | Payer: Self-pay | Attending: Emergency Medicine | Admitting: Emergency Medicine

## 2012-10-17 DIAGNOSIS — Z21 Asymptomatic human immunodeficiency virus [HIV] infection status: Secondary | ICD-10-CM | POA: Insufficient documentation

## 2012-10-17 DIAGNOSIS — Z86718 Personal history of other venous thrombosis and embolism: Secondary | ICD-10-CM | POA: Insufficient documentation

## 2012-10-17 DIAGNOSIS — R079 Chest pain, unspecified: Secondary | ICD-10-CM | POA: Insufficient documentation

## 2012-10-17 DIAGNOSIS — M79605 Pain in left leg: Secondary | ICD-10-CM

## 2012-10-17 DIAGNOSIS — E039 Hypothyroidism, unspecified: Secondary | ICD-10-CM | POA: Insufficient documentation

## 2012-10-17 DIAGNOSIS — Z8709 Personal history of other diseases of the respiratory system: Secondary | ICD-10-CM | POA: Insufficient documentation

## 2012-10-17 DIAGNOSIS — M79609 Pain in unspecified limb: Secondary | ICD-10-CM | POA: Insufficient documentation

## 2012-10-17 DIAGNOSIS — M542 Cervicalgia: Secondary | ICD-10-CM | POA: Insufficient documentation

## 2012-10-17 DIAGNOSIS — E040 Nontoxic diffuse goiter: Secondary | ICD-10-CM | POA: Insufficient documentation

## 2012-10-17 DIAGNOSIS — Z79899 Other long term (current) drug therapy: Secondary | ICD-10-CM | POA: Insufficient documentation

## 2012-10-17 DIAGNOSIS — E669 Obesity, unspecified: Secondary | ICD-10-CM | POA: Insufficient documentation

## 2012-10-17 DIAGNOSIS — F17200 Nicotine dependence, unspecified, uncomplicated: Secondary | ICD-10-CM | POA: Insufficient documentation

## 2012-10-17 DIAGNOSIS — R220 Localized swelling, mass and lump, head: Secondary | ICD-10-CM | POA: Insufficient documentation

## 2012-10-17 DIAGNOSIS — Z86711 Personal history of pulmonary embolism: Secondary | ICD-10-CM | POA: Insufficient documentation

## 2012-10-17 DIAGNOSIS — K219 Gastro-esophageal reflux disease without esophagitis: Secondary | ICD-10-CM | POA: Insufficient documentation

## 2012-10-17 LAB — CBC WITH DIFFERENTIAL/PLATELET
Lymphocytes Relative: 27 % (ref 12–46)
Lymphs Abs: 1.5 10*3/uL (ref 0.7–4.0)
Neutrophils Relative %: 64 % (ref 43–77)
Platelets: 166 10*3/uL (ref 150–400)
RBC: 4.27 MIL/uL (ref 3.87–5.11)
WBC: 5.4 10*3/uL (ref 4.0–10.5)

## 2012-10-17 LAB — COMPREHENSIVE METABOLIC PANEL
ALT: 18 U/L (ref 0–35)
Alkaline Phosphatase: 53 U/L (ref 39–117)
CO2: 28 mEq/L (ref 19–32)
Chloride: 104 mEq/L (ref 96–112)
GFR calc Af Amer: >90 mL/min (ref >90)
GFR calc non Af Amer: >90 mL/min (ref >90)
Glucose, Bld: 100 mg/dL — ABNORMAL HIGH (ref 70–99)
Potassium: 4.4 mEq/L (ref 3.5–5.1)
Sodium: 138 mEq/L (ref 135–145)
Total Bilirubin: 0.3 mg/dL (ref 0.3–1.2)

## 2012-10-17 NOTE — ED Notes (Signed)
PT. REPORTS LEFT CHEST PAIN /LEFT FACIAL PAIN AND LEFT LEG PAIN RADIATING DOWN TO LEFT FOOT ONSET TODAY  , STATES HISTORY OF DVT AND PE. SLIGHT SOB .

## 2012-10-18 ENCOUNTER — Ambulatory Visit (HOSPITAL_COMMUNITY): Payer: Self-pay | Attending: Emergency Medicine

## 2012-10-18 ENCOUNTER — Encounter (HOSPITAL_COMMUNITY): Payer: Self-pay | Admitting: *Deleted

## 2012-10-18 LAB — TROPONIN I: Troponin I: <0.3 ng/mL (ref <0.30)

## 2012-10-18 MED ORDER — HYDROMORPHONE HCL PF 1 MG/ML IJ SOLN
1.0000 mg | Freq: Once | INTRAMUSCULAR | Status: AC
  Administered 2012-10-18: 1 mg via INTRAVENOUS
  Filled 2012-10-18: qty 1

## 2012-10-18 MED ORDER — ONDANSETRON HCL 4 MG/2ML IJ SOLN
4.0000 mg | Freq: Once | INTRAMUSCULAR | Status: AC
  Administered 2012-10-18: 4 mg via INTRAVENOUS
  Filled 2012-10-18: qty 2

## 2012-10-18 MED ORDER — HYDROCODONE-ACETAMINOPHEN 5-325 MG PO TABS: 1.0000 | ORAL_TABLET | Freq: Four times a day (QID) | ORAL | Status: DC | PRN

## 2012-10-18 NOTE — ED Provider Notes (Signed)
History     CSN: [id number]  Arrival date & time 10/17/12  2220   First MD Initiated Contact with Patient 10/18/12 0018      Chief Complaint  Patient presents with  . Chest Pain  . Leg Pain    (Consider location/radiation/quality/duration/timing/severity/associated sxs/prior treatment) HPI Hx per PT. For the last few weeks having LLE pain.  She is on Xarelto for PE/ DVT and has scheduled follow up through [facility].  She was evaluated about a few weeks ago at [facility] and had her LLE measured and told that is was larger than her RLE but did not receive an [place].  Pain is sharp and located posterior thigh and she states is similar to when she was diagnosed with DVT.  She is also having some Left neck pain intermittently for the last few days. No SOB. No cough or fevers. No syncope. No trauma. Neck discomfort is mild, leg pain is moderate to severe. No redness/ rash. Hurts to walk on it. Leg pain seems worse tonight  Past Medical History  Diagnosis Date  . Obesity   . Hypothyroidism   . HIV (human immunodeficiency virus infection)   . Hyperthyroidism   . Goiter   . GERD (gastroesophageal reflux disease)   . Pulmonary infarction   . DVT (deep venous thrombosis)   . PE (pulmonary embolism)     Past Surgical History  Procedure Date  . Hernia repair   . Cesarean section     Family History  Problem Relation Age of Onset  . Bell's palsy Mother   . Hypertension Mother   . Hypertension Father     History  Substance Use Topics  . Smoking status: Current Every Day Smoker -- 1.0 packs/day for 25 years    Types: Cigarettes  . Smokeless tobacco: Never Used  . Alcohol Use: Yes     Comment: rarely    OB History    Grav Para Term Preterm Abortions TAB SAB Ect Mult Living                  Review of Systems  Constitutional: Negative for fever and chills.  HENT: Positive for neck pain.   Eyes: Negative for visual disturbance.  Respiratory: Negative for cough and  shortness of breath.   Cardiovascular: Positive for leg swelling.  Gastrointestinal: Negative for vomiting and abdominal pain.  Genitourinary: Negative for dysuria.  Musculoskeletal: Negative for back pain.  Skin: Negative for rash and wound.  Neurological: Negative for headaches.  All other systems reviewed and are negative.    Allergies  Review of patient's allergies indicates no known allergies.  Home Medications   Current Outpatient Rx  Name  Route  Sig  Dispense  Refill  . ELVITEG-COBICIS-EMTRICIT-TENOF 150-150-200-300 MG PO TABS   Oral   Take 1 tablet by mouth daily with breakfast.         . FERROUS GLUCONATE 324 (38 FE) MG PO TABS   Oral   Take 324 mg by mouth daily with breakfast.         . HYDROCODONE-ACETAMINOPHEN 5-325 MG PO TABS   Oral   Take 1-2 tablets by mouth every 4 (four) hours as needed. For pain         . LEVOTHYROXINE SODIUM 175 MCG PO TABS   Oral   Take 175 mcg by mouth daily.         Marland Kitchen OMEPRAZOLE MAGNESIUM 20 MG PO TBEC   Oral  Take 20 mg by mouth daily.          Marland Kitchen RIVAROXABAN 20 MG PO TABS   Oral   Take 1 tablet (20 mg total) by mouth daily with supper.   30 tablet   0     Start after completing 21days of th 15mg  dose 2x/d .Marland KitchenMarland Kitchen     BP 107/88  Pulse 96  Temp 97.8 F (36.6 C) (Oral)  Resp 16  SpO2 96%  LMP 10/03/2012  Physical Exam  Constitutional: She is oriented to person, place, and time. She appears well-developed and well-nourished.  HENT:  Head: Normocephalic and atraumatic.  Mouth/Throat: Oropharynx is clear and moist. No oropharyngeal exudate.  Eyes: Conjunctivae normal and EOM are normal. Pupils are equal, round, and reactive to light.  Neck: Trachea normal and normal range of motion. Neck supple. No tracheal deviation present. No thyromegaly present.       No midline cervical tenderness or deformity, good ROM  Cardiovascular: Normal rate, regular rhythm, S1 normal, S2 normal and normal pulses.     No systolic  murmur is present   No diastolic murmur is present  Pulses:      Radial pulses are 2+ on the right side, and 2+ on the left side.  Pulmonary/Chest: Effort normal and breath sounds normal. No stridor. She has no wheezes. She has no rhonchi. She has no rales. She exhibits no tenderness.  Abdominal: Soft. Normal appearance and bowel sounds are normal. There is no tenderness. There is no CVA tenderness and negative Murphy's sign.  Musculoskeletal:       LLE with tenderness posterior thigh, no cords or erythema. BLE:s Calves nontender, no cords or erythema, negative Homans sign  Lymphadenopathy:    She has no cervical adenopathy.  Neurological: She is alert and oriented to person, place, and time. She has normal strength. No cranial nerve deficit or sensory deficit. GCS eye subscore is 4. GCS verbal subscore is 5. GCS motor subscore is 6.  Skin: Skin is warm and dry. No rash noted. She is not diaphoretic.  Psychiatric: Her speech is normal.       Cooperative and appropriate    ED Course  Procedures (including critical care time)  Results for orders placed during the hospital encounter of 10/17/12  CBC WITH DIFFERENTIAL      Component Value Range   WBC 5.4  4.0 - 10.5 K/uL   RBC 4.27  3.87 - 5.11 MIL/uL   Hemoglobin 12.5  12.0 - 15.0 g/dL   HCT 82.9  56.2 - 13.0 %   MCV 86.7  78.0 - 100.0 fL   MCH 29.3  26.0 - 34.0 pg   MCHC 33.8  30.0 - 36.0 g/dL   RDW 86.5 (*) 78.4 - 69.6 %   Platelets 166  150 - 400 K/uL   Neutrophils Relative 64  43 - 77 %   Neutro Abs 3.4  1.7 - 7.7 K/uL   Lymphocytes Relative 27  12 - 46 %   Lymphs Abs 1.5  0.7 - 4.0 K/uL   Monocytes Relative 6  3 - 12 %   Monocytes Absolute 0.3  0.1 - 1.0 K/uL   Eosinophils Relative 3  0 - 5 %   Eosinophils Absolute 0.2  0.0 - 0.7 K/uL   Basophils Relative 0  0 - 1 %   Basophils Absolute 0.0  0.0 - 0.1 K/uL  COMPREHENSIVE METABOLIC PANEL      Component Value Range   Sodium  138  135 - 145 mEq/L   Potassium 4.4  3.5 - 5.1  mEq/L   Chloride 104  96 - 112 mEq/L   CO2 28  19 - 32 mEq/L   Glucose, Bld 100 (*) 70 - 99 mg/dL   BUN 6  6 - 23 mg/dL   Creatinine, Ser 1.61  0.50 - 1.10 mg/dL   Calcium 09.6  8.4 - 04.5 mg/dL   Total Protein 7.7  6.0 - 8.3 g/dL   Albumin 3.7  3.5 - 5.2 g/dL   AST 20  0 - 37 U/L   ALT 18  0 - 35 U/L   Alkaline Phosphatase 53  39 - 117 U/L   Total Bilirubin 0.3  0.3 - 1.2 mg/dL   GFR calc non Af Amer >90  >90 mL/min   GFR calc Af Amer >90  >90 mL/min  D-DIMER, QUANTITATIVE      Component Value Range   D-Dimer, Quant <0.27  0.00 - 0.48 ug/mL-FEU  POCT I-STAT TROPONIN I      Component Value Range   Troponin i, poc 0.00  0.00 - 0.08 ng/mL   Comment 3           TROPONIN I      Component Value Range   Troponin I <0.30  <0.30 ng/mL   Dg Chest 2 View  10/17/2012  *RADIOLOGY REPORT*  Clinical Data: Chest and leg pain.  Shortness of breath.  History of prior pulmonary embolus and DVT.  CHEST - 2 VIEW  Comparison: 09/17/2012  Findings: Mild hyperinflation. The heart size and pulmonary vascularity are normal. The lungs appear clear and expanded without focal air space disease or consolidation. No blunting of the costophrenic angles.  No pneumothorax.  Mediastinal contours appear intact.  Mild thoracic curvature convex towards the left.  No significant change since previous study.  IMPRESSION: No evidence of active pulmonary disease.  Mild hyperinflation.   Original Report Authenticated By: Burman Nieves, M.D.      Date: 10/18/2012  Rate: 72  Rhythm: normal sinus rhythm  QRS Axis: normal  Intervals: normal  ST/T Wave abnormalities: nonspecific ST changes  Conduction Disutrbances:none  Narrative Interpretation:   Old EKG Reviewed: unchanged  IV DIlaudid  Old records reviewed. Had PE less than a month ago neg for PE and at that was having persistent SOB, those symptoms have since resolved.   Recheck - leg pain improved, neck pain resolved. Serial troponins WNL. Plan [place] through  vascular lab in am. Referral to [place] provided. Stable for discharge home, neg d-dimer.   MDM  Leg pain h/o DVT on Xarelto. No [place] available tonight/ plan vascular lab in am. PT evaluated with ECG, labs, and imaging as above. Improved with IV narcotics. VS and nursing notes reviewed - PT denies any SOB or CP on serial exams. Presentation tonight does not suggest need for repeat emergent CT PE study.         Sunnie Nielsen, MD 10/18/12 [phone]

## 2012-10-19 ENCOUNTER — Ambulatory Visit (HOSPITAL_COMMUNITY): Payer: Self-pay | Attending: Emergency Medicine

## 2012-10-22 ENCOUNTER — Ambulatory Visit (HOSPITAL_COMMUNITY)
Admission: RE | Admit: 2012-10-22 | Discharge: 2012-10-22 | Disposition: A | Discharge status: Home / Self Care | Payer: Self-pay | Source: Ambulatory Visit | Attending: Emergency Medicine | Admitting: Emergency Medicine

## 2012-10-22 ENCOUNTER — Other Ambulatory Visit (HOSPITAL_COMMUNITY): Payer: Self-pay | Admitting: Emergency Medicine

## 2012-10-22 DIAGNOSIS — M25569 Pain in unspecified knee: Secondary | ICD-10-CM

## 2012-10-22 DIAGNOSIS — M79609 Pain in unspecified limb: Secondary | ICD-10-CM | POA: Insufficient documentation

## 2012-10-22 MED ORDER — RIVAROXABAN 20 MG PO TABS: 20.0000 mg | ORAL_TABLET | Freq: Every day | ORAL | Status: DC

## 2012-10-22 NOTE — Progress Notes (Signed)
*  PRELIMINARY RESULTS* Vascular Ultrasound Left lower extremity venous duplex has been completed.  Preliminary findings: Left = no evidence of DVT or baker's cyst.  Farrel Demark, RDMS, RVT  10/22/2012, 12:10 PM

## 2012-11-27 ENCOUNTER — Encounter (HOSPITAL_COMMUNITY): Payer: Self-pay | Admitting: Emergency Medicine

## 2012-11-27 ENCOUNTER — Emergency Department (HOSPITAL_COMMUNITY)
Admission: EM | Admit: 2012-11-27 | Discharge: 2012-11-27 | Disposition: A | Discharge status: Home / Self Care | Payer: Self-pay | Attending: Emergency Medicine | Admitting: Emergency Medicine

## 2012-11-27 ENCOUNTER — Emergency Department (HOSPITAL_COMMUNITY): Payer: Self-pay

## 2012-11-27 DIAGNOSIS — M79609 Pain in unspecified limb: Secondary | ICD-10-CM | POA: Insufficient documentation

## 2012-11-27 DIAGNOSIS — Z79899 Other long term (current) drug therapy: Secondary | ICD-10-CM | POA: Insufficient documentation

## 2012-11-27 DIAGNOSIS — K219 Gastro-esophageal reflux disease without esophagitis: Secondary | ICD-10-CM | POA: Insufficient documentation

## 2012-11-27 DIAGNOSIS — Z21 Asymptomatic human immunodeficiency virus [HIV] infection status: Secondary | ICD-10-CM | POA: Insufficient documentation

## 2012-11-27 DIAGNOSIS — Z86718 Personal history of other venous thrombosis and embolism: Secondary | ICD-10-CM | POA: Insufficient documentation

## 2012-11-27 DIAGNOSIS — R079 Chest pain, unspecified: Secondary | ICD-10-CM | POA: Insufficient documentation

## 2012-11-27 DIAGNOSIS — Z86711 Personal history of pulmonary embolism: Secondary | ICD-10-CM | POA: Insufficient documentation

## 2012-11-27 DIAGNOSIS — Z8709 Personal history of other diseases of the respiratory system: Secondary | ICD-10-CM | POA: Insufficient documentation

## 2012-11-27 DIAGNOSIS — E039 Hypothyroidism, unspecified: Secondary | ICD-10-CM | POA: Insufficient documentation

## 2012-11-27 DIAGNOSIS — R0602 Shortness of breath: Secondary | ICD-10-CM | POA: Insufficient documentation

## 2012-11-27 DIAGNOSIS — F17200 Nicotine dependence, unspecified, uncomplicated: Secondary | ICD-10-CM | POA: Insufficient documentation

## 2012-11-27 LAB — COMPREHENSIVE METABOLIC PANEL
ALT: 18 U/L (ref 0–35)
AST: 22 U/L (ref 0–37)
Albumin: 4.1 g/dL (ref 3.5–5.2)
Alkaline Phosphatase: 56 U/L (ref 39–117)
BUN: 12 mg/dL (ref 6–23)
CO2: 28 mEq/L (ref 19–32)
Calcium: 10.5 mg/dL (ref 8.4–10.5)
Chloride: 102 mEq/L (ref 96–112)
Creatinine, Ser: 0.87 mg/dL (ref 0.50–1.10)
GFR calc Af Amer: >90 mL/min (ref >90)
GFR calc non Af Amer: 83 mL/min — ABNORMAL LOW (ref >90)
Glucose, Bld: 93 mg/dL (ref 70–99)
Potassium: 3.5 mEq/L (ref 3.5–5.1)
Sodium: 139 mEq/L (ref 135–145)
Total Bilirubin: 0.3 mg/dL (ref 0.3–1.2)
Total Protein: 7.9 g/dL (ref 6.0–8.3)

## 2012-11-27 LAB — CBC WITH DIFFERENTIAL/PLATELET
Basophils Relative: 0 % (ref 0–1)
HCT: 40.3 % (ref 36.0–46.0)
Hemoglobin: 13.9 g/dL (ref 12.0–15.0)
MCH: 29.6 pg (ref 26.0–34.0)
MCHC: 34.5 g/dL (ref 30.0–36.0)
Monocytes Absolute: 0.3 10*3/uL (ref 0.1–1.0)
Monocytes Relative: 7 % (ref 3–12)
Neutro Abs: 2.4 10*3/uL (ref 1.7–7.7)

## 2012-11-27 LAB — PROTIME-INR
INR: 2.11 — ABNORMAL HIGH (ref 0.00–1.49)
Prothrombin Time: 22.8 seconds — ABNORMAL HIGH (ref 11.6–15.2)

## 2012-11-27 LAB — POCT I-STAT TROPONIN I: Troponin i, poc: 0 ng/mL (ref 0.00–0.08)

## 2012-11-27 MED ORDER — HYDROCODONE-ACETAMINOPHEN 5-325 MG PO TABS
2.0000 | ORAL_TABLET | Freq: Once | ORAL | Status: AC
  Administered 2012-11-27: 2 via ORAL
  Filled 2012-11-27: qty 2

## 2012-11-27 NOTE — ED Notes (Signed)
PT. REPORTS RIGHT CHEST PAIN ONSET LAST NIGHT WITH SOB , DRY COUGH AND SLIGHT NAUSEA,  DENIES DIAPHORESIS , STATES HISTORY OF DVT AND PE.  TOOK 1 BABY ASA YESTERDAY MORNING .

## 2012-11-27 NOTE — ED Provider Notes (Signed)
History     CSN: [id number]  Arrival date & time 11/27/12  0110   First MD Initiated Contact with Patient 11/27/12 [phone]      Chief Complaint  Patient presents with  . Chest Pain   HPI  History provided by the patient. Patient is a 37 year old female with history of HIV, hyperthyroidism, DVT and right-sided PE diagnosed in December currently on Xarelto presents with complaints of 2 days of right-sided sharp chest pain as well as some increased pains in left lower extremity. Symptoms are similar to her initial pains and symptoms of DVT and PE in similar locations. Pain has had some waxing waning for the past 2 days but without significant improvements. Patient occasionally uses Vicodin for pain with good improvement but states she does not like to take this often and has to work. Symptoms are also associated with slight shortness of breath. She denies any associated cough or hemoptysis. There's been no fever, chills or sweats. She denies any skin changes to lower leg were significant signs of swelling. Denies any other aggravating or alleviating factors. No other associated symptoms.     Past Medical History  Diagnosis Date  . Obesity   . Hypothyroidism   . HIV (human immunodeficiency virus infection)   . Hyperthyroidism   . Goiter   . GERD (gastroesophageal reflux disease)   . Pulmonary infarction   . DVT (deep venous thrombosis)   . PE (pulmonary embolism)     Past Surgical History  Procedure Laterality Date  . Hernia repair    . Cesarean section      Family History  Problem Relation Age of Onset  . Bell's palsy Mother   . Hypertension Mother   . Hypertension Father     History  Substance Use Topics  . Smoking status: Current Every Day Smoker -- 1.00 packs/day for 25 years    Types: Cigarettes  . Smokeless tobacco: Never Used  . Alcohol Use: Yes     Comment: rarely    OB History   Grav Para Term Preterm Abortions TAB SAB Ect Mult Living                   Review of Systems  Constitutional: Negative for fever, chills and diaphoresis.  Respiratory: Positive for shortness of breath. Negative for cough.   Cardiovascular: Positive for chest pain.  Gastrointestinal: Negative for nausea, vomiting and abdominal pain.  All other systems reviewed and are negative.    Allergies  Review of patient's allergies indicates no known allergies.  Home Medications   Current Outpatient Rx  Name  Route  Sig  Dispense  Refill  . acetaminophen (TYLENOL) 500 MG tablet   Oral   Take 1,000 mg by mouth every 6 (six) hours as needed. For pain         . elvitegravir-cobicistat-emtricitabine-tenofovir (STRIBILD) 150-150-200-300 MG TABS   Oral   Take 1 tablet by mouth daily with breakfast.         . ferrous gluconate (FERGON) 324 MG tablet   Oral   Take 324 mg by mouth daily with breakfast.         . HYDROcodone-acetaminophen (NORCO/VICODIN) 5-325 MG per tablet   Oral   Take 1 tablet by mouth every 6 (six) hours as needed for pain.   15 tablet   0   . levothyroxine (SYNTHROID, LEVOTHROID) 175 MCG tablet   Oral   Take 175 mcg by mouth daily.         Marland Kitchen  omeprazole (PRILOSEC OTC) 20 MG tablet   Oral   Take 20 mg by mouth daily.          . Rivaroxaban (XARELTO) 20 MG TABS   Oral   Take 1 tablet (20 mg total) by mouth daily with supper.   30 tablet   4     BP 104/66  Pulse 60  Temp(Src) 97.4 F (36.3 C) (Oral)  Resp 12  SpO2 99%  LMP 11/25/2012  Physical Exam  Nursing note and vitals reviewed. Constitutional: She is oriented to person, place, and time. She appears well-developed and well-nourished. No distress.  HENT:  Head: Normocephalic.  Cardiovascular: Normal rate and regular rhythm.   Pulmonary/Chest: Effort normal and breath sounds normal. No respiratory distress. She has no wheezes. She has no rales.  Abdominal: Soft. There is no tenderness.  Musculoskeletal: Normal range of motion.  There is possible mild swelling  to the left lower extremity. No skin changes. No increased warmth. No significant tenderness. Normal sensation and pulses her feet.  Neurological: She is alert and oriented to person, place, and time.  Skin: Skin is warm and dry. No rash noted.  Psychiatric: She has a normal mood and affect. Her behavior is normal.    ED Course  Procedures  Results for orders placed during the hospital encounter of 11/27/12  CBC WITH DIFFERENTIAL      Result Value Range   WBC 4.6  4.0 - 10.5 K/uL   RBC 4.69  3.87 - 5.11 MIL/uL   Hemoglobin 13.9  12.0 - 15.0 g/dL   HCT 16.1  09.6 - 04.5 %   MCV 85.9  78.0 - 100.0 fL   MCH 29.6  26.0 - 34.0 pg   MCHC 34.5  30.0 - 36.0 g/dL   RDW 40.9  81.1 - 91.4 %   Platelets 171  150 - 400 K/uL   Neutrophils Relative 53  43 - 77 %   Neutro Abs 2.4  1.7 - 7.7 K/uL   Lymphocytes Relative 37  12 - 46 %   Lymphs Abs 1.7  0.7 - 4.0 K/uL   Monocytes Relative 7  3 - 12 %   Monocytes Absolute 0.3  0.1 - 1.0 K/uL   Eosinophils Relative 2  0 - 5 %   Eosinophils Absolute 0.1  0.0 - 0.7 K/uL   Basophils Relative 0  0 - 1 %   Basophils Absolute 0.0  0.0 - 0.1 K/uL  COMPREHENSIVE METABOLIC PANEL      Result Value Range   Sodium 139  135 - 145 mEq/L   Potassium 3.5  3.5 - 5.1 mEq/L   Chloride 102  96 - 112 mEq/L   CO2 28  19 - 32 mEq/L   Glucose, Bld 93  70 - 99 mg/dL   BUN 12  6 - 23 mg/dL   Creatinine, Ser 7.82  0.50 - 1.10 mg/dL   Calcium 95.6  8.4 - 21.3 mg/dL   Total Protein 7.9  6.0 - 8.3 g/dL   Albumin 4.1  3.5 - 5.2 g/dL   AST 22  0 - 37 U/L   ALT 18  0 - 35 U/L   Alkaline Phosphatase 56  39 - 117 U/L   Total Bilirubin 0.3  0.3 - 1.2 mg/dL   GFR calc non Af Amer 83 (*) >90 mL/min   GFR calc Af Amer >90  >90 mL/min  PROTIME-INR      Result Value Range   Prothrombin  Time 22.8 (*) 11.6 - 15.2 seconds   INR 2.11 (*) 0.00 - 1.49  POCT I-STAT TROPONIN I      Result Value Range   Troponin i, poc 0.00  0.00 - 0.08 ng/mL   Comment 3                Dg  Chest 2 View  11/27/2012  *RADIOLOGY REPORT*  Clinical Data: Right-sided chest pain.  Prior history of lower extremity DVT and pulmonary embolism.  CHEST - 2 VIEW  Comparison: Two-view chest x-ray 10/17/2012, 09/17/2012, 08/26/2012.  Findings: Cardiac silhouette normal in size, unchanged.  Thoracic aorta mildly tortuous, unchanged.  Hilar and mediastinal contours otherwise unremarkable.  Lungs clear.  Bronchovascular markings normal.  Pulmonary vascularity normal.  No pneumothorax.  No pleural effusions.  Mild degenerative changes involving the thoracic spine and slight thoracic scoliosis convex left, unchanged.  IMPRESSION: No acute cardiopulmonary disease.  Stable examination.   Original Report Authenticated By: Hulan Saas, M.D.      1. Chest pain   2. SOB (shortness of breath)   3. Leg pain, diffuse, left       MDM  Patient seen and evaluated. Patient resting comfortably appears well with normal heart rate, respirations and O2 sats on room air. She does not appear in any acute distress.  Lab tests and chest x-ray unremarkable. Patient has been taking Xarelto daily as prescribed. At this time do not feel patient requires CT image of the chest as able but likely change or alter her treatment course. EKG and troponin negative. Pain is right-sided and atypical for ACS. At this time patient felt stable for discharge home and will followup with PCP.  Pt discussed with Attending Physician and she agrees with plan.    Date: 11/27/2012  Rate: 67  Rhythm: normal sinus rhythm and sinus arrhythmia  QRS Axis: normal  Intervals: normal  ST/T Wave abnormalities: normal  Conduction Disutrbances:none  Narrative Interpretation:   Old EKG Reviewed: unchanged          Angus Seller, PA-C 11/27/12 [phone]

## 2012-11-27 NOTE — ED Provider Notes (Signed)
Medical screening examination/treatment/procedure(s) were performed by non-physician practitioner and as supervising physician I was immediately available for consultation/collaboration.  Afsheen Antony M Pearline Yerby, MD 11/27/12 0624 

## 2013-01-03 ENCOUNTER — Emergency Department (INDEPENDENT_AMBULATORY_CARE_PROVIDER_SITE_OTHER)
Admission: EM | Admit: 2013-01-03 | Discharge: 2013-01-03 | Disposition: A | Discharge status: Home / Self Care | Payer: Self-pay | Source: Home / Self Care | Attending: Family Medicine | Admitting: Family Medicine

## 2013-01-03 ENCOUNTER — Encounter (HOSPITAL_COMMUNITY): Payer: Self-pay

## 2013-01-03 DIAGNOSIS — I80299 Phlebitis and thrombophlebitis of other deep vessels of unspecified lower extremity: Secondary | ICD-10-CM

## 2013-01-03 DIAGNOSIS — I87029 Postthrombotic syndrome with inflammation of unspecified lower extremity: Secondary | ICD-10-CM | POA: Diagnosis present

## 2013-01-03 DIAGNOSIS — I82402 Acute embolism and thrombosis of unspecified deep veins of left lower extremity: Secondary | ICD-10-CM

## 2013-01-03 DIAGNOSIS — I87022 Postthrombotic syndrome with inflammation of left lower extremity: Secondary | ICD-10-CM

## 2013-01-03 DIAGNOSIS — I82409 Acute embolism and thrombosis of unspecified deep veins of unspecified lower extremity: Secondary | ICD-10-CM

## 2013-01-03 DIAGNOSIS — I2699 Other pulmonary embolism without acute cor pulmonale: Secondary | ICD-10-CM

## 2013-01-03 DIAGNOSIS — J302 Other seasonal allergic rhinitis: Secondary | ICD-10-CM

## 2013-01-03 NOTE — ED Notes (Signed)
Patient here for follow up appt History of DVT and PE

## 2013-01-03 NOTE — ED Provider Notes (Signed)
History     CSN: [id number]  Arrival date & time 01/03/13  1017   First MD Initiated Contact with Patient 01/03/13 1124      Chief Complaint  Patient presents with  . DVT    (Consider location/radiation/quality/duration/timing/severity/associated sxs/prior treatment) HPI Pt is presenting today for follow up.  She has missed several appointments and follow ups.  She hasn't been seen here since December 2013.  She had been seen here for hospital follow up for PE/DVT and had been on xarelto and is currently taking the medication.  She is reporting that she is having pain in the leg and swelling.   Pt says that she is taking the xarelto 20 mg daily.  She has been getting the medication for free for 1 year.  She is not wearing compression hoses.  Pt says that she having to stand from 8-10 hours per day at work.    Past Medical History  Diagnosis Date  . Obesity   . Hypothyroidism   . HIV (human immunodeficiency virus infection)   . Hyperthyroidism   . Goiter   . GERD (gastroesophageal reflux disease)   . Pulmonary infarction   . DVT (deep venous thrombosis)   . PE (pulmonary embolism)     Past Surgical History  Procedure Laterality Date  . Hernia repair    . Cesarean section      Family History  Problem Relation Age of Onset  . Bell's palsy Mother   . Hypertension Mother   . Hypertension Father     History  Substance Use Topics  . Smoking status: Current Every Day Smoker -- 1.00 packs/day for 25 years    Types: Cigarettes  . Smokeless tobacco: Never Used  . Alcohol Use: Yes     Comment: rarely    OB History   Grav Para Term Preterm Abortions TAB SAB Ect Mult Living                  Review of Systems Constitutional: Negative.  HENT: Negative.  Respiratory: Negative.  Cardiovascular: Negative.  Gastrointestinal: Negative.  Endocrine: Negative.  Genitourinary: Negative.  Musculoskeletal: pain in left leg and swelling   Skin: Negative.   Allergic/Immunologic: Negative.  Neurological: Negative.  Hematological: Negative.  Psychiatric/Behavioral: Negative.  All other systems reviewed and are negative   Allergies  Review of patient's allergies indicates no known allergies.  Home Medications   Current Outpatient Rx  Name  Route  Sig  Dispense  Refill  . acetaminophen (TYLENOL) 500 MG tablet   Oral   Take 1,000 mg by mouth every 6 (six) hours as needed. For pain         . elvitegravir-cobicistat-emtricitabine-tenofovir (STRIBILD) 150-150-200-300 MG TABS   Oral   Take 1 tablet by mouth daily with breakfast.         . ferrous gluconate (FERGON) 324 MG tablet   Oral   Take 324 mg by mouth daily with breakfast.         . HYDROcodone-acetaminophen (NORCO/VICODIN) 5-325 MG per tablet   Oral   Take 1 tablet by mouth every 6 (six) hours as needed for pain.   15 tablet   0   . levothyroxine (SYNTHROID, LEVOTHROID) 175 MCG tablet   Oral   Take 175 mcg by mouth daily.         Marland Kitchen omeprazole (PRILOSEC OTC) 20 MG tablet   Oral   Take 20 mg by mouth daily.          Marland Kitchen  Rivaroxaban (XARELTO) 20 MG TABS   Oral   Take 1 tablet (20 mg total) by mouth daily with supper.   30 tablet   4     BP 116/77  Pulse 86  Temp(Src) 97.8 F (36.6 C) (Oral)  Resp 18  SpO2 100%  Physical Exam Nursing note and vitals reviewed.  Constitutional: She is oriented to person, place, and time. She appears well-developed and well-nourished. No distress.  HENT:  Head: Normocephalic and atraumatic.  Eyes: Conjunctivae and EOM are normal. Pupils are equal, round, and reactive to light.  Neck: Normal range of motion. Neck supple. No JVD present. No tracheal deviation present. No thyromegaly present.  Cardiovascular: Normal rate, regular rhythm and normal heart sounds.  Pulmonary/Chest: Effort normal and breath sounds normal. No respiratory distress. She has no wheezes.  Abdominal: Soft. Bowel sounds are normal.  Musculoskeletal:  mild edema in left calf, no cords palpated, no pretibial edema noted, normal pulses in lower extremities bilateral, Normal range of motion. She exhibits no edema and no tenderness.  Lymphadenopathy:  She has no cervical adenopathy.  Neurological: She is alert and oriented to person, place, and time. She has normal reflexes.  Skin: Skin is warm and dry.  Psychiatric: She has a normal mood and affect. Her behavior is normal. Judgment and thought content normal.    ED Course  Procedures (including critical care time)  Labs Reviewed - No data to display No results found.   No diagnosis found.    MDM  IMPRESSION  DVT/PE  Post thrombotic syndrome LLE, improving  RECOMMENDATIONS / PLAN Recommended that patient start using compression hoses to LLE especially when working and standing for long periods of time Continue Xarelto for 6 months Continue to encourage acetaminophen for pain as needed.  Pt asking for stronger pain meds but will not prescribe that at this time.  It has been nearly 5 months since DVT was diagnosed and pt has not followed up here as recommended.  Will send pt for ultrasound of LLE to evaluate further.    FOLLOW UP 1 month for recheck,  The patient was given clear instructions to go to ER or return to medical center if symptoms don't improve, worsen or new problems develop.  The patient verbalized understanding.  The patient was told to call to get lab results if they haven't heard anything in the next week.           Cleora Fleet, MD 01/03/13 1140

## 2013-01-06 ENCOUNTER — Ambulatory Visit (HOSPITAL_COMMUNITY)
Admit: 2013-01-06 | Discharge: 2013-01-06 | Disposition: A | Discharge status: Home / Self Care | Payer: Self-pay | Attending: Family Medicine | Admitting: Family Medicine

## 2013-01-06 DIAGNOSIS — M79609 Pain in unspecified limb: Secondary | ICD-10-CM | POA: Insufficient documentation

## 2013-01-06 DIAGNOSIS — Z86718 Personal history of other venous thrombosis and embolism: Secondary | ICD-10-CM | POA: Insufficient documentation

## 2013-01-06 DIAGNOSIS — Z86711 Personal history of pulmonary embolism: Secondary | ICD-10-CM | POA: Insufficient documentation

## 2013-01-06 DIAGNOSIS — I87022 Postthrombotic syndrome with inflammation of left lower extremity: Secondary | ICD-10-CM

## 2013-01-06 NOTE — Progress Notes (Signed)
VASCULAR LAB PRELIMINARY  PRELIMINARY  PRELIMINARY  PRELIMINARY  Left lower extremity venous Doppler completed.    Preliminary report:  There is no acute DVT or SVT noted in the left lower extremity.  There appears to be some mild thickening of the left popliteal vein, consistent with residual clot.  Kiera Hussey, RVT 01/06/2013, 10:53 AM

## 2013-09-25 ENCOUNTER — Emergency Department (HOSPITAL_BASED_OUTPATIENT_CLINIC_OR_DEPARTMENT_OTHER)
Admission: EM | Admit: 2013-09-25 | Discharge: 2013-09-26 | Disposition: A | Discharge status: Home / Self Care | Payer: Self-pay | Attending: Emergency Medicine | Admitting: Emergency Medicine

## 2013-09-25 ENCOUNTER — Encounter (HOSPITAL_BASED_OUTPATIENT_CLINIC_OR_DEPARTMENT_OTHER): Payer: Self-pay | Admitting: Emergency Medicine

## 2013-09-25 DIAGNOSIS — Z79899 Other long term (current) drug therapy: Secondary | ICD-10-CM | POA: Insufficient documentation

## 2013-09-25 DIAGNOSIS — Z21 Asymptomatic human immunodeficiency virus [HIV] infection status: Secondary | ICD-10-CM | POA: Insufficient documentation

## 2013-09-25 DIAGNOSIS — Z3202 Encounter for pregnancy test, result negative: Secondary | ICD-10-CM | POA: Insufficient documentation

## 2013-09-25 DIAGNOSIS — K219 Gastro-esophageal reflux disease without esophagitis: Secondary | ICD-10-CM | POA: Insufficient documentation

## 2013-09-25 DIAGNOSIS — R0602 Shortness of breath: Secondary | ICD-10-CM | POA: Insufficient documentation

## 2013-09-25 DIAGNOSIS — R059 Cough, unspecified: Secondary | ICD-10-CM | POA: Insufficient documentation

## 2013-09-25 DIAGNOSIS — Z7901 Long term (current) use of anticoagulants: Secondary | ICD-10-CM | POA: Insufficient documentation

## 2013-09-25 DIAGNOSIS — E039 Hypothyroidism, unspecified: Secondary | ICD-10-CM | POA: Insufficient documentation

## 2013-09-25 DIAGNOSIS — M79609 Pain in unspecified limb: Secondary | ICD-10-CM | POA: Insufficient documentation

## 2013-09-25 DIAGNOSIS — M79606 Pain in leg, unspecified: Secondary | ICD-10-CM

## 2013-09-25 DIAGNOSIS — F17200 Nicotine dependence, unspecified, uncomplicated: Secondary | ICD-10-CM | POA: Insufficient documentation

## 2013-09-25 DIAGNOSIS — Z86711 Personal history of pulmonary embolism: Secondary | ICD-10-CM | POA: Insufficient documentation

## 2013-09-25 DIAGNOSIS — M6281 Muscle weakness (generalized): Secondary | ICD-10-CM | POA: Insufficient documentation

## 2013-09-25 DIAGNOSIS — R05 Cough: Secondary | ICD-10-CM | POA: Insufficient documentation

## 2013-09-25 DIAGNOSIS — E669 Obesity, unspecified: Secondary | ICD-10-CM | POA: Insufficient documentation

## 2013-09-25 DIAGNOSIS — Z86718 Personal history of other venous thrombosis and embolism: Secondary | ICD-10-CM | POA: Insufficient documentation

## 2013-09-25 LAB — CBC WITH DIFFERENTIAL/PLATELET
BASOS ABS: 0 10*3/uL (ref 0.0–0.1)
Basophils Relative: 1 % (ref 0–1)
EOS PCT: 2 % (ref 0–5)
Eosinophils Absolute: 0.1 10*3/uL (ref 0.0–0.7)
HEMATOCRIT: 34.3 % — AB (ref 36.0–46.0)
HEMOGLOBIN: 11.2 g/dL — AB (ref 12.0–15.0)
LYMPHS ABS: 1.5 10*3/uL (ref 0.7–4.0)
LYMPHS PCT: 36 % (ref 12–46)
MCH: 28 pg (ref 26.0–34.0)
MCHC: 32.7 g/dL (ref 30.0–36.0)
MCV: 85.8 fL (ref 78.0–100.0)
MONO ABS: 0.5 10*3/uL (ref 0.1–1.0)
Monocytes Relative: 13 % — ABNORMAL HIGH (ref 3–12)
NEUTROS ABS: 2 10*3/uL (ref 1.7–7.7)
Neutrophils Relative %: 49 % (ref 43–77)
Platelets: 167 10*3/uL (ref 150–400)
RBC: 4 MIL/uL (ref 3.87–5.11)
RDW: 14.8 % (ref 11.5–15.5)
WBC: 4.2 10*3/uL (ref 4.0–10.5)

## 2013-09-25 LAB — BASIC METABOLIC PANEL
BUN: 10 mg/dL (ref 6–23)
CALCIUM: 10.2 mg/dL (ref 8.4–10.5)
CHLORIDE: 104 meq/L (ref 96–112)
CO2: 25 meq/L (ref 19–32)
CREATININE: 0.8 mg/dL (ref 0.50–1.10)
GFR calc Af Amer: >90 mL/min (ref >90)
GFR calc non Af Amer: >90 mL/min (ref >90)
GLUCOSE: 93 mg/dL (ref 70–99)
Potassium: 3.9 mEq/L (ref 3.7–5.3)
Sodium: 140 mEq/L (ref 137–147)

## 2013-09-25 LAB — TROPONIN I

## 2013-09-25 LAB — D-DIMER, QUANTITATIVE: D-Dimer, Quant: <0.27 ug/mL-FEU (ref 0.00–0.48)

## 2013-09-25 MED ORDER — KETOROLAC TROMETHAMINE 30 MG/ML IJ SOLN
30.0000 mg | Freq: Once | INTRAMUSCULAR | Status: AC
  Administered 2013-09-26: 30 mg via INTRAVENOUS
  Filled 2013-09-25: qty 1

## 2013-09-25 NOTE — ED Provider Notes (Signed)
CSN: [id number]     Arrival date & time 09/25/13  2156 History  This chart was scribed for Stephanie Miranda Stephanie CordsK Jireh Vinas-Rasch, MD by Leone PayorSonum Patel, ED Scribe. This patient was seen in room MH06/MH06 and the patient's care was started 11:06 PM.    Chief Complaint  Patient presents with  . Leg Pain    Patient is a 38 y.o. female presenting with leg pain. The history is provided by the patient. No language interpreter was used.  Leg Pain Location:  Leg Time since incident:  3 days Injury: no   Leg location:  L lower leg Pain details:    Quality:  Aching   Radiates to:  Does not radiate   Severity:  Moderate   Onset quality:  Gradual   Duration:  3 days   Timing:  Constant   Progression:  Unchanged Chronicity:  Recurrent Dislocation: no   Foreign body present:  No foreign bodies Prior injury to area:  No Relieved by:  Nothing Worsened by:  Nothing tried Ineffective treatments:  None tried Associated symptoms: muscle weakness   Associated symptoms: no back pain, no decreased ROM, no fever, no numbness, no stiffness and no tingling   Risk factors: no frequent fractures     HPI Comments: Stephanie Miranda is a 38 y.o. female who presents to the Emergency Department complaining of 3 days ago of constant, gradually worsening left leg pain and swelling. She also reports having constant SOB for the past 2-3 days. She has a cough that is non-productive.  She denies known trauma to the affected leg, long distance travel, recent hospitalizations. She reports taking Xarelto but has stopped taking it for the past few months.   Past Medical History  Diagnosis Date  . Obesity   . Hypothyroidism   . HIV (human immunodeficiency virus infection)   . Hyperthyroidism   . Goiter   . GERD (gastroesophageal reflux disease)   . Pulmonary infarction   . DVT (deep venous thrombosis)   . PE (pulmonary embolism)    Past Surgical History  Procedure Laterality Date  . Hernia repair    . Cesarean section      Family History  Problem Relation Age of Onset  . Bell's palsy Mother   . Hypertension Mother   . Hypertension Father    History  Substance Use Topics  . Smoking status: Current Every Day Smoker -- 1.00 packs/day for 25 years    Types: Cigarettes  . Smokeless tobacco: Never Used  . Alcohol Use: Yes     Comment: rarely   OB History   Grav Para Term Preterm Abortions TAB SAB Ect Mult Living                 Review of Systems  Constitutional: Negative for fever.  Respiratory: Positive for cough and shortness of breath. Negative for chest tightness.   Cardiovascular: Negative for chest pain and palpitations.  Musculoskeletal: Positive for myalgias (left leg pain). Negative for back pain and stiffness.  All other systems reviewed and are negative.    Allergies  Review of patient's allergies indicates no known allergies.  Home Medications   Current Outpatient Rx  Name  Route  Sig  Dispense  Refill  . acetaminophen (TYLENOL) 500 MG tablet   Oral   Take 1,000 mg by mouth every 6 (six) hours as needed. For pain         . elvitegravir-cobicistat-emtricitabine-tenofovir (STRIBILD) 150-150-200-300 MG TABS   Oral   Take 1  tablet by mouth daily with breakfast.         . ferrous gluconate (FERGON) 324 MG tablet   Oral   Take 324 mg by mouth daily with breakfast.         . levothyroxine (SYNTHROID, LEVOTHROID) 175 MCG tablet   Oral   Take 175 mcg by mouth daily.         Marland Kitchen omeprazole (PRILOSEC OTC) 20 MG tablet   Oral   Take 20 mg by mouth daily.          . Rivaroxaban (XARELTO) 20 MG TABS   Oral   Take 1 tablet (20 mg total) by mouth daily with supper.   30 tablet   4    BP 110/82  Pulse 68  Temp(Src) 98.2 F (36.8 C) (Oral)  Resp 20  Wt 185 lb (83.915 kg)  SpO2 100%  LMP 09/04/2013 Physical Exam  Nursing note and vitals reviewed. Constitutional: She is oriented to person, place, and time. She appears well-developed and well-nourished.  HENT:   Head: Normocephalic and atraumatic.  Mouth/Throat: Oropharynx is clear and moist and mucous membranes are normal. Mucous membranes are not dry. No oropharyngeal exudate.  Eyes: Pupils are equal, round, and reactive to light.  Neck: Normal range of motion. Neck supple.  Cardiovascular: Normal rate, regular rhythm, normal heart sounds and intact distal pulses.   DP pulse intact.   Pulmonary/Chest: Effort normal and breath sounds normal. No respiratory distress. She has no wheezes. She has no rales.  Abdominal: Soft. Bowel sounds are normal. She exhibits no distension. There is no tenderness. There is no rebound and no guarding.  Musculoskeletal: Normal range of motion. She exhibits no edema and no tenderness.  Bilateral LE compartments are soft. Negative Homan's sign B.   No erythema, no warmth. Intact dorsalis pedis B   Neurological: She is alert and oriented to person, place, and time. She has normal reflexes. She displays normal reflexes.  Achilles tendon relfex intact. No patellar alta or baja.   Skin: Skin is warm and dry.  Psychiatric: She has a normal mood and affect.    ED Course  Procedures (including critical care time)  DIAGNOSTIC STUDIES: Oxygen Saturation is 100% on RA, normal by my interpretation.    COORDINATION OF CARE: 11:12 PM Discussed treatment plan with pt at bedside and pt agreed to plan.   Labs Review Labs Reviewed - No data to display Imaging Review No results found.  EKG Interpretation   None      Date: 09/26/2013  Rate: 61  Rhythm: normal sinus rhythm  QRS Axis: normal  Intervals: normal  ST/T Wave abnormalities: normal  Conduction Disutrbances: none  Narrative Interpretation: unremarkable      MDM  No diagnosis found. ddimer is negative, and there is no swelling of the lower extremity no cords.  PERC negative wells 0 and in the setting of days of symptoms with negative troponin and normal EKG ACS is excluded.  Feels improved post  medication.  Patient is low risk for DVT.  Is safe for d/c will set up for outpatient ultrasound of the LLE.  Do not feel patient needs CT for PE with negative ddimer and normal vital signs and ekg.    I personally performed the services described in this documentation, which was scribed in my presence. The recorded information has been reviewed and is accurate.    Jasmine Awe, MD 09/26/13 [phone]

## 2013-09-25 NOTE — ED Notes (Signed)
C/o left calf pain x 3 days  w swelling   Denies inj,  Also sob

## 2013-09-25 NOTE — ED Notes (Signed)
Left leg pain, swelling and sob x 3 days.

## 2013-09-26 ENCOUNTER — Ambulatory Visit (HOSPITAL_BASED_OUTPATIENT_CLINIC_OR_DEPARTMENT_OTHER): Payer: Self-pay

## 2013-09-26 ENCOUNTER — Emergency Department (HOSPITAL_BASED_OUTPATIENT_CLINIC_OR_DEPARTMENT_OTHER): Payer: Self-pay

## 2013-09-26 LAB — PREGNANCY, URINE: PREG TEST UR: NEGATIVE

## 2013-09-26 MED ORDER — ENOXAPARIN SODIUM 100 MG/ML ~~LOC~~ SOLN: 85.0000 mg | SUBCUTANEOUS | Status: DC

## 2013-09-26 MED ORDER — MORPHINE SULFATE 4 MG/ML IJ SOLN
4.0000 mg | Freq: Once | INTRAMUSCULAR | Status: AC
  Administered 2013-09-26: 4 mg via INTRAVENOUS
  Filled 2013-09-26: qty 1

## 2013-09-26 MED ORDER — HYDROCODONE-ACETAMINOPHEN 5-325 MG PO TABS: 1.0000 | ORAL_TABLET | Freq: Four times a day (QID) | ORAL | Status: AC | PRN

## 2013-09-26 MED ORDER — ENOXAPARIN SODIUM 100 MG/ML ~~LOC~~ SOLN: 85.0000 mg | Freq: Two times a day (BID) | SUBCUTANEOUS | Status: DC

## 2013-09-26 NOTE — Progress Notes (Signed)
ANTICOAGULATION CONSULT NOTE - Initial Consult  Pharmacy Consult for Lovenox Indication: r/o VTE  No Known Allergies  Patient Measurements: Height: 5' 6.53" (169 cm) Weight: 185 lb (83.915 kg) IBW/kg (Calculated) : 60.53  Vital Signs: Temp: 98.5 F (36.9 C) (01/16 0046) Temp src: Oral (01/16 0046) BP: 119/71 mmHg (01/16 0046) Pulse Rate: 65 (01/16 0046)  Labs:  Recent Labs  09/25/13 2310  HGB 11.2*  HCT 34.3*  PLT 167  CREATININE 0.80  TROPONINI <0.30    Estimated Creatinine Clearance: 104.2 ml/min (by C-G formula based on Cr of 0.8).   Medical History: Past Medical History  Diagnosis Date  . Obesity   . Hypothyroidism   . HIV (human immunodeficiency virus infection)   . Hyperthyroidism   . Goiter   . GERD (gastroesophageal reflux disease)   . Pulmonary infarction   . DVT (deep venous thrombosis)   . PE (pulmonary embolism)       Assessment: 38yo female c/o calf pain and SOB x3d, awaiting imaging, to begin LMWH for possible PE/DVT.  Goal of Therapy:  Anti-Xa level 0.6-1 units/ml 4hrs after LMWH dose given Monitor platelets by anticoagulation protocol: Yes   Plan:  Will start Lovenox 85mg  SQ Q12H and monitor CBC.  Vernard GamblesVeronda Abisai Coble, PharmD, BCPS  09/26/2013,1:46 AM

## 2013-09-29 ENCOUNTER — Ambulatory Visit (HOSPITAL_BASED_OUTPATIENT_CLINIC_OR_DEPARTMENT_OTHER): Admission: RE | Admit: 2013-09-29 | Payer: Self-pay | Source: Ambulatory Visit

## 2014-12-07 IMAGING — CT CT HEAD W/O CM
1 series · 16 of 30 positions shown, 20 images · non-contrast
Comparison: 03/13/2012

CLINICAL DATA: Chest pain, headache.

CT HEAD WITHOUT CONTRAST
TECHNIQUE: Contiguous axial images were obtained from the base of
the skull through the vertex without contrast.

[Series 2: brain · axial · 0.47mm/px · z∈[+101,+241]mm · 16 of 32 slices shown, 20 images]
[im 2/32  brain]
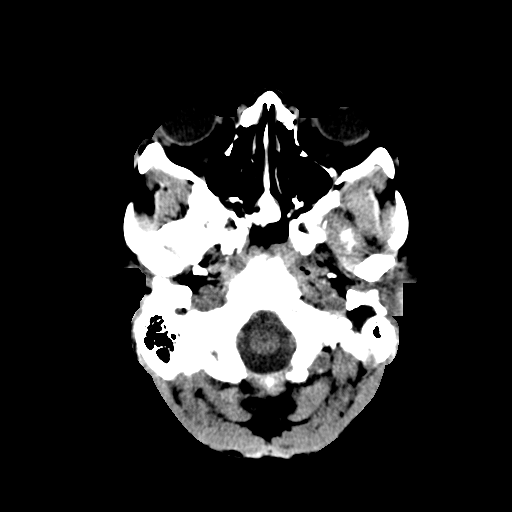
[im 2/32  bone]
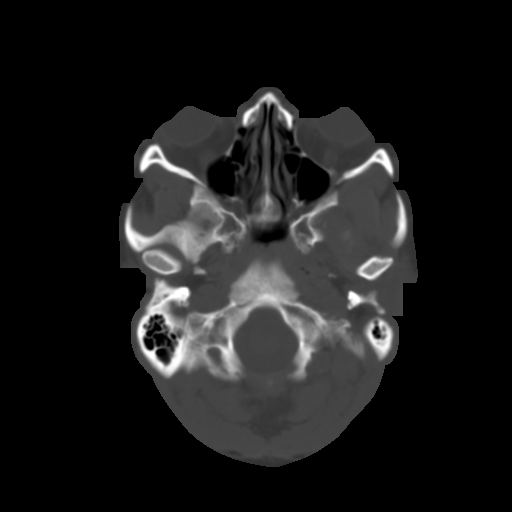
[im 4/32  brain]
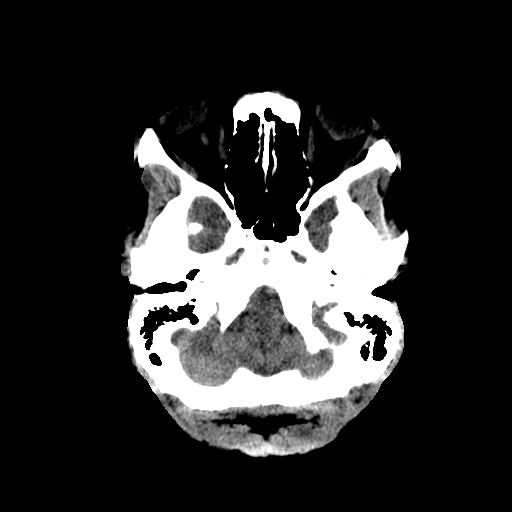
[im 6/32  brain]
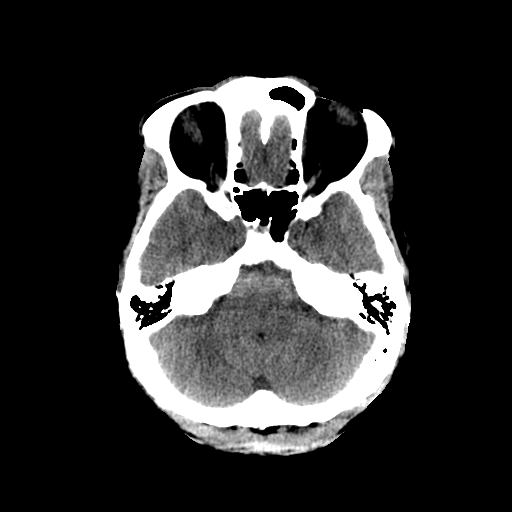
[im 8/32  brain]
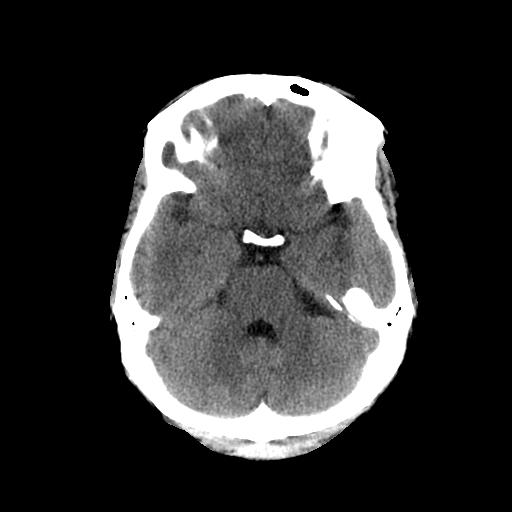
[im 9/32  brain]
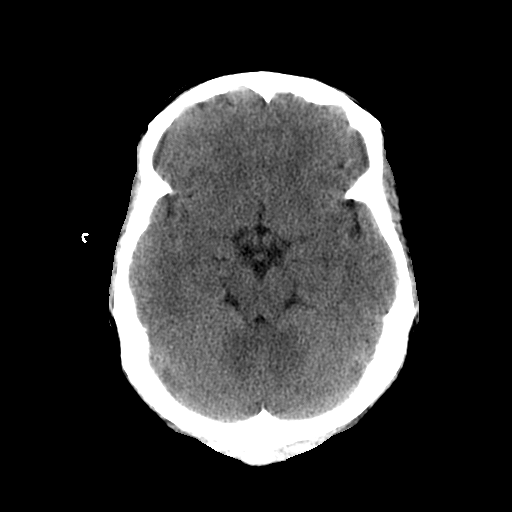
[im 9/32  bone]
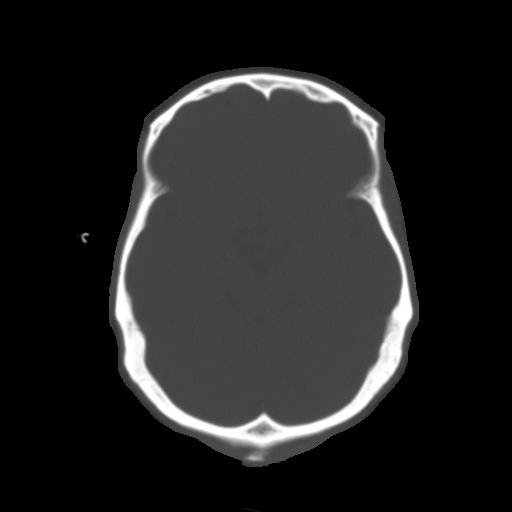
[im 11/32  brain]
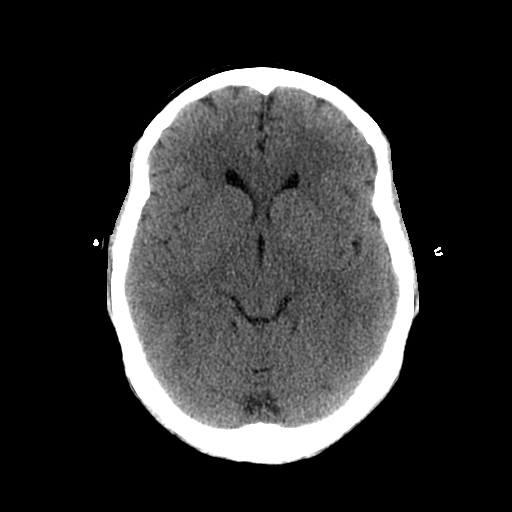
[im 13/32  brain]
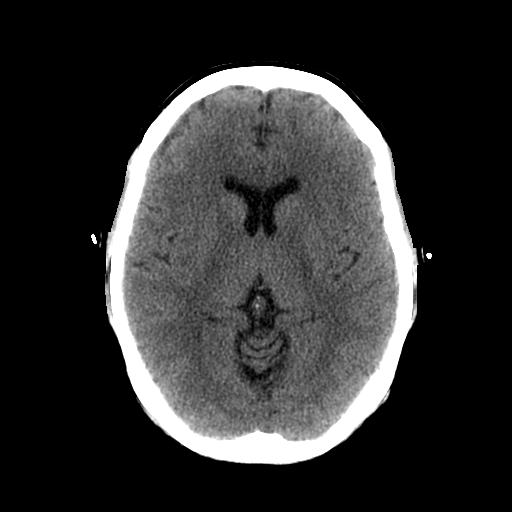
[im 15/32  brain]
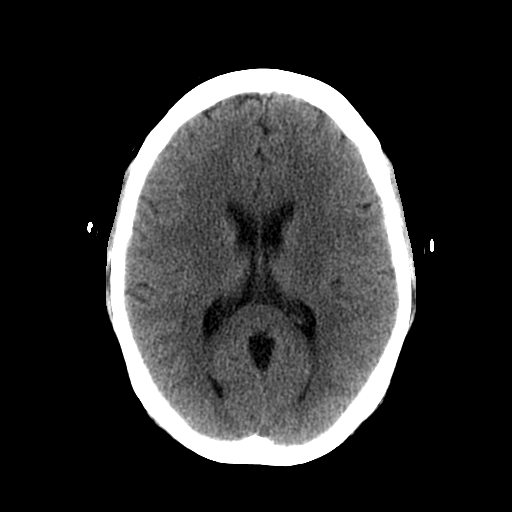
[im 17/32  brain]
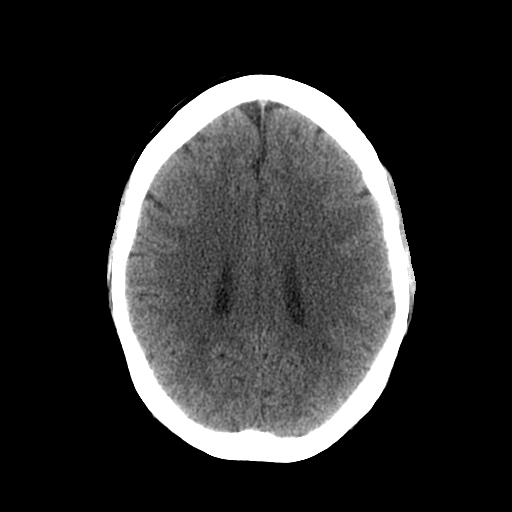
[im 17/32  bone]
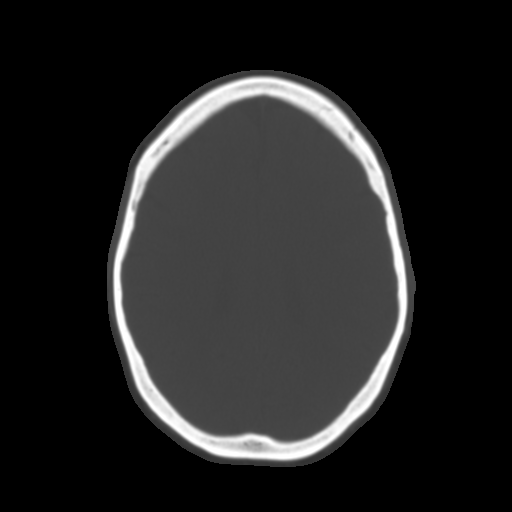
[im 19/32  brain]
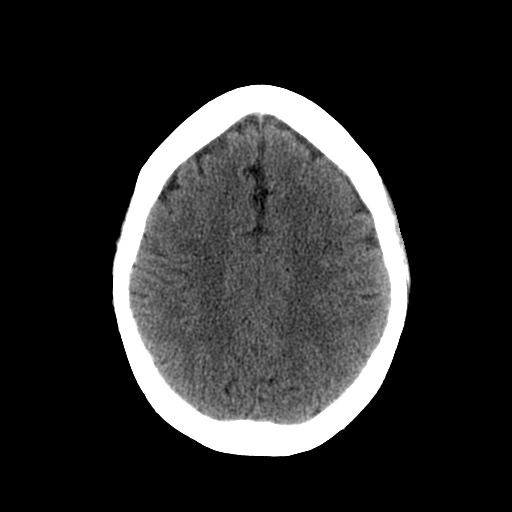
[im 21/32  brain]
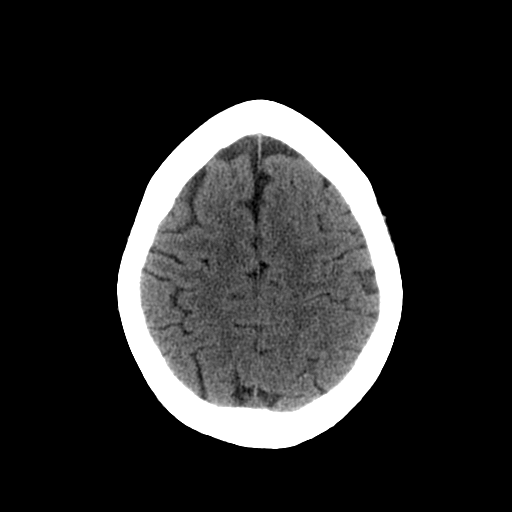
[im 23/32  brain]
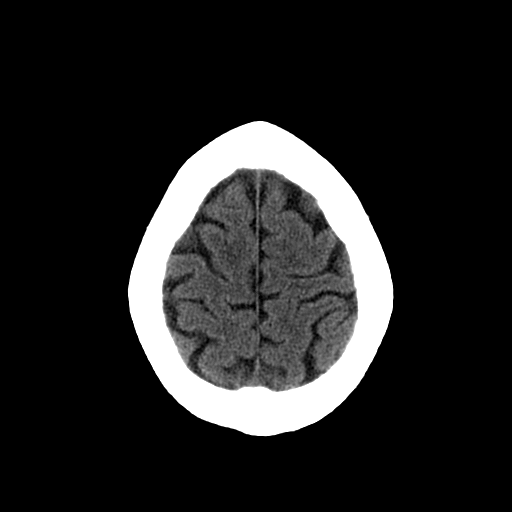
[im 24/32  brain]
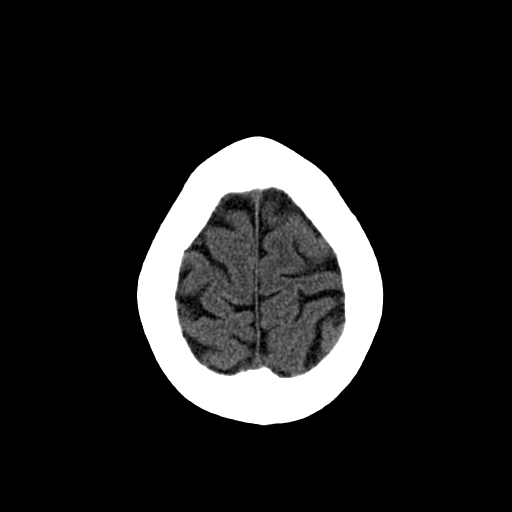
[im 24/32  bone]
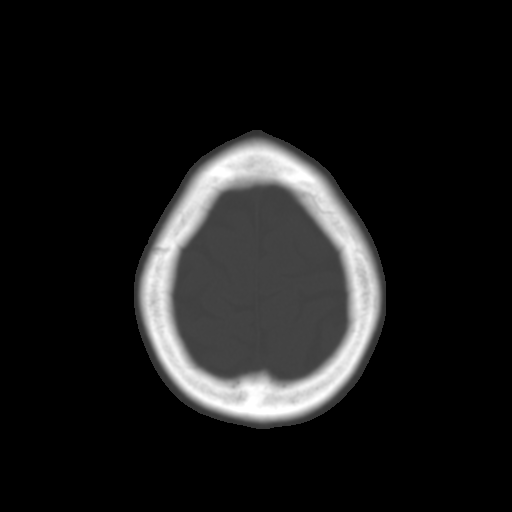
[im 26/32  brain]
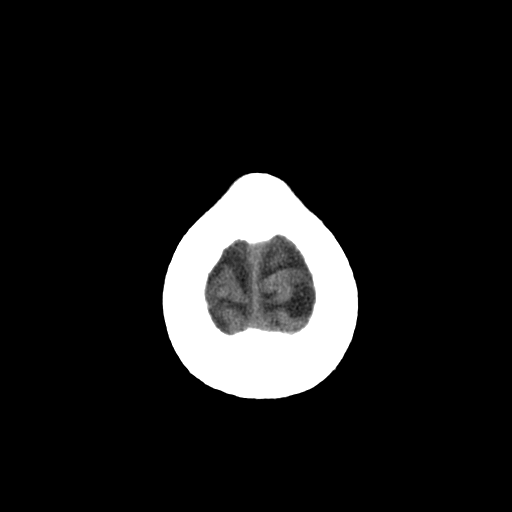
[im 28/32  brain]
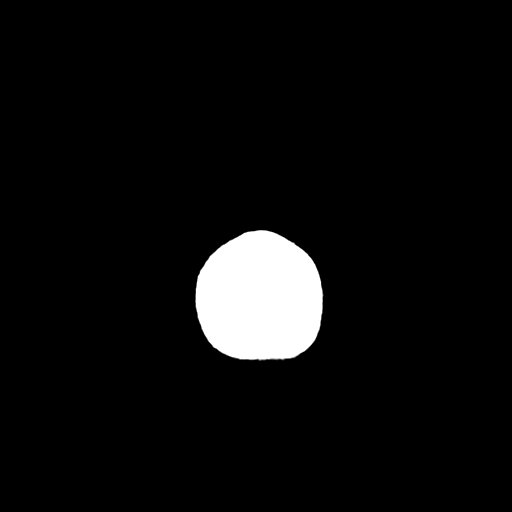
[im 30/32  brain]
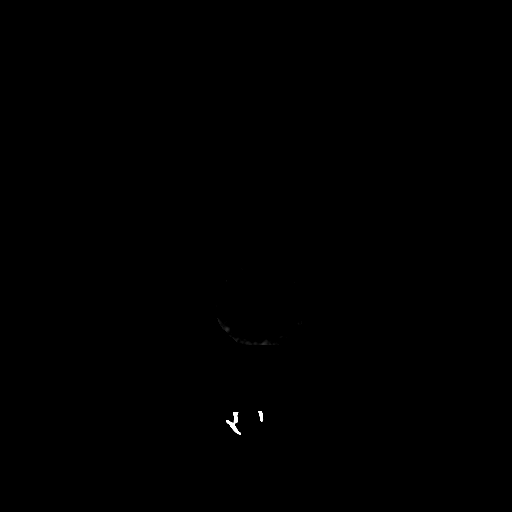

[16 of 30 positions shown; findings below may reference images not displayed]

FINDINGS: No acute intracranial abnormality.  Specifically, no
hemorrhage, hydrocephalus, mass lesion, acute infarction, or
significant intracranial injury.  No acute calvarial abnormality.
Visualized paranasal sinuses and mastoids clear.  Orbital soft
tissues unremarkable.
IMPRESSION: No acute intracranial abnormality.

## 2014-12-08 ENCOUNTER — Other Ambulatory Visit: Payer: Self-pay | Admitting: Pharmacist

## 2015-01-16 IMAGING — CR DG CHEST 2V
2 series · 2 of 2 positions shown · non-contrast
Comparison: 09/17/2012

CLINICAL DATA: Chest and leg pain.  Shortness of breath.  History
of prior pulmonary embolus and DVT.

CHEST - 2 VIEW

[w chest pa]
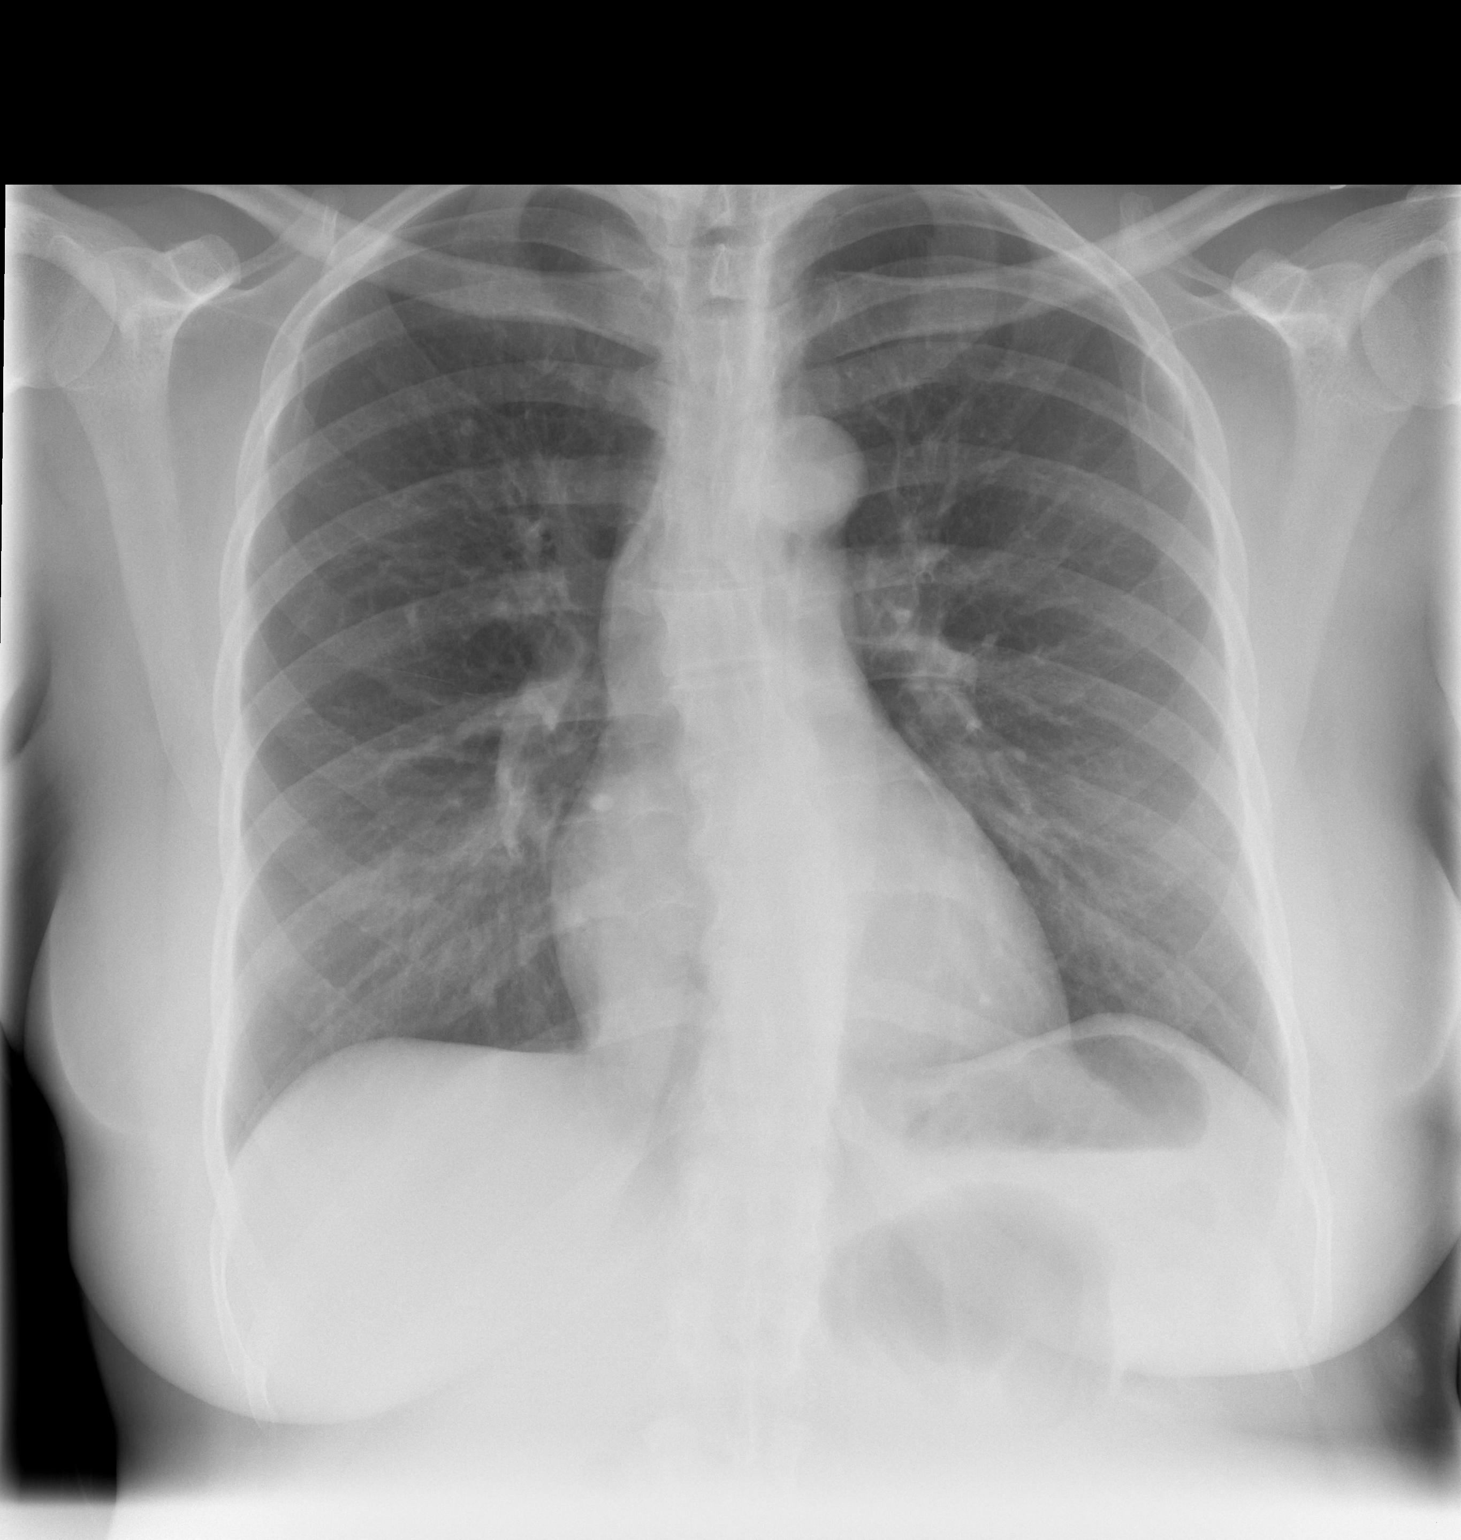

[w chest lat]
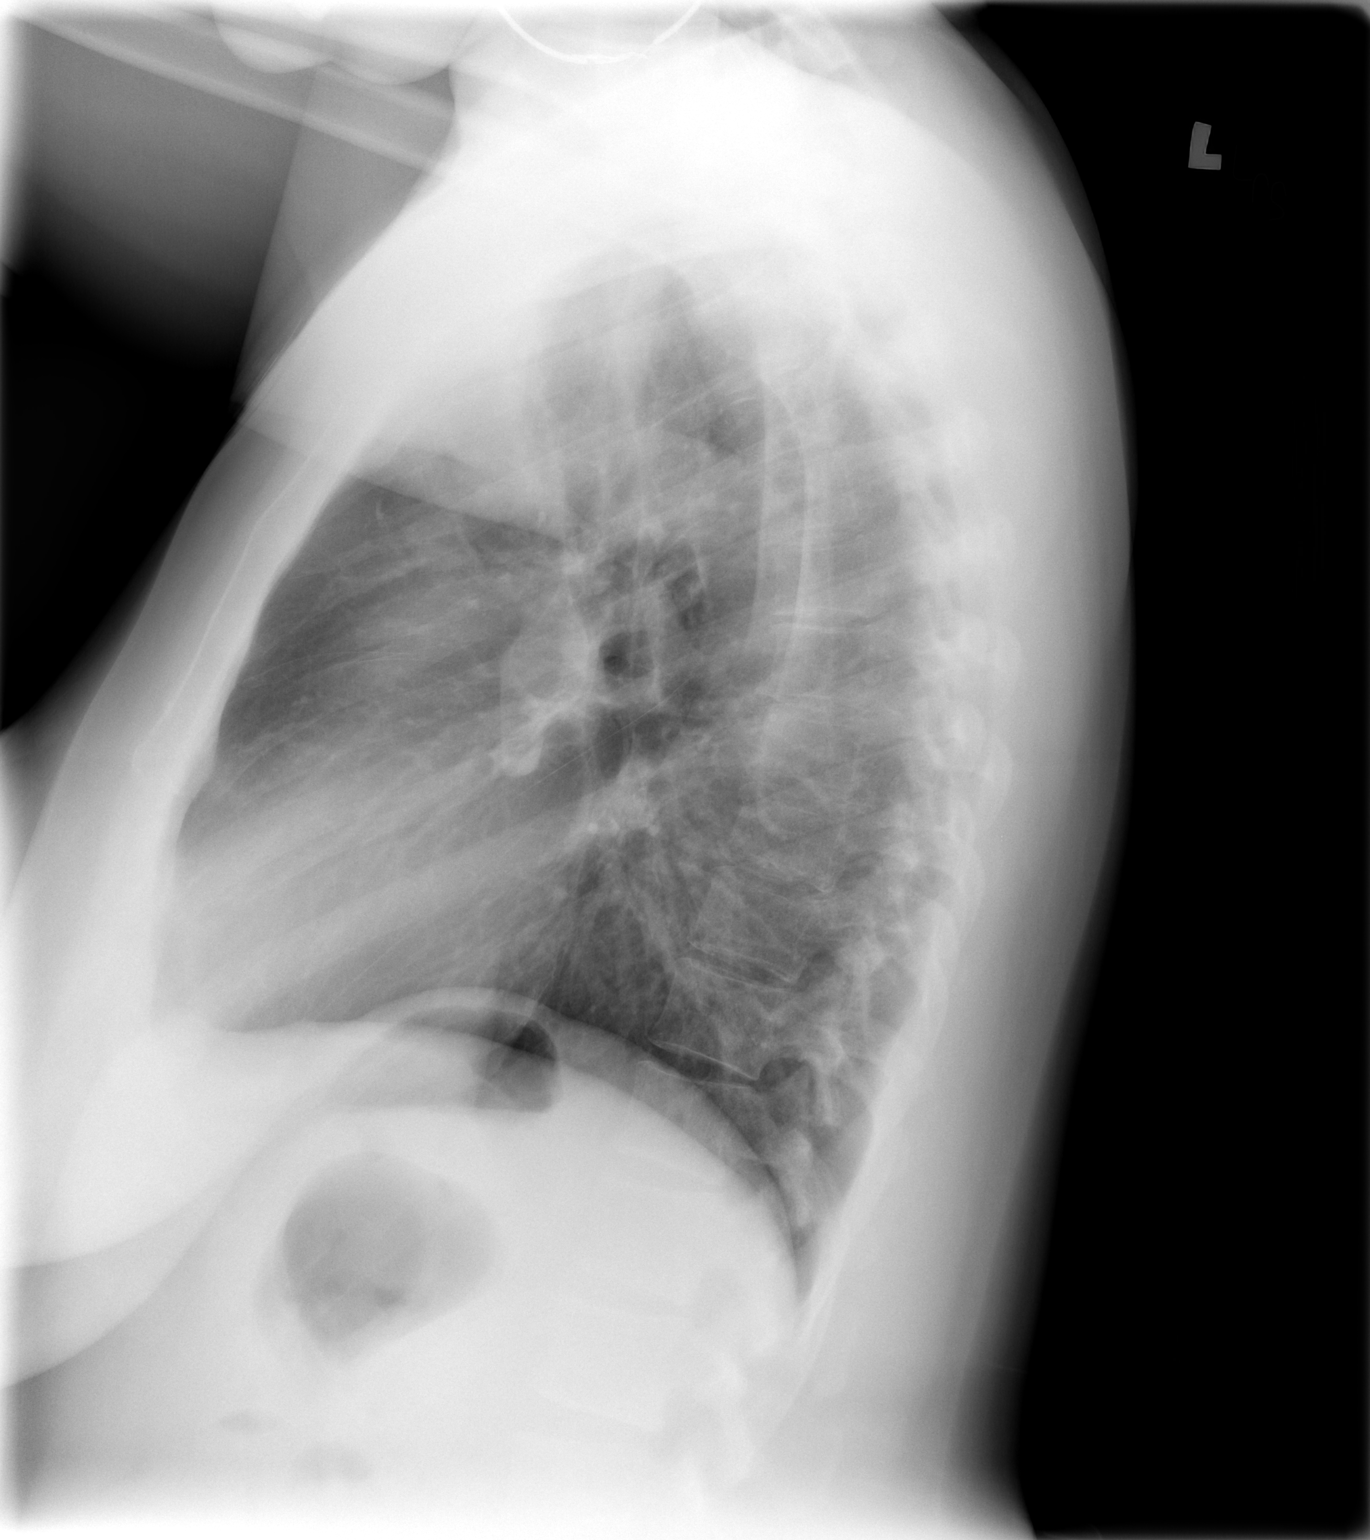

[2 of 2 positions shown; findings below may reference images not displayed]

FINDINGS: Mild hyperinflation. The heart size and pulmonary
vascularity are normal. The lungs appear clear and expanded without
focal air space disease or consolidation. No blunting of the
costophrenic angles.  No pneumothorax.  Mediastinal contours appear
intact.  Mild thoracic curvature convex towards the left.  No
significant change since previous study.
IMPRESSION: No evidence of active pulmonary disease.  Mild hyperinflation.

## 2019-12-11 ENCOUNTER — Ambulatory Visit: Payer: Self-pay | Attending: Internal Medicine
